# Patient Record
Sex: Male | Born: 1950 | Race: Black or African American | Hispanic: No | Marital: Married | Smoking: Former smoker
Health system: Southern US, Community
[De-identification: ages and names within clinical notes are randomized; demographics above are authoritative.]

## PROBLEM LIST (undated history)

## (undated) DIAGNOSIS — E785 Hyperlipidemia, unspecified: Secondary | ICD-10-CM

## (undated) DIAGNOSIS — I10 Essential (primary) hypertension: Secondary | ICD-10-CM

## (undated) DIAGNOSIS — E669 Obesity, unspecified: Secondary | ICD-10-CM

## (undated) DIAGNOSIS — I2510 Atherosclerotic heart disease of native coronary artery without angina pectoris: Secondary | ICD-10-CM

## (undated) DIAGNOSIS — R079 Chest pain, unspecified: Secondary | ICD-10-CM

## (undated) DIAGNOSIS — R37 Sexual dysfunction, unspecified: Secondary | ICD-10-CM

## (undated) HISTORY — DX: Hyperlipidemia, unspecified: E78.5

## (undated) HISTORY — PX: CORONARY STENT INTERVENTION: CATH118234

## (undated) HISTORY — PX: KNEE SURGERY: SHX244

## (undated) HISTORY — DX: Obesity, unspecified: E66.9

## (undated) HISTORY — DX: Sexual dysfunction, unspecified: R37

---

## 1997-10-11 ENCOUNTER — Ambulatory Visit (HOSPITAL_COMMUNITY): Admission: RE | Admit: 1997-10-11 | Discharge: 1997-10-11 | Payer: Self-pay | Admitting: Internal Medicine

## 1999-05-01 ENCOUNTER — Encounter: Payer: Self-pay | Admitting: Internal Medicine

## 1999-05-01 ENCOUNTER — Encounter: Admission: RE | Admit: 1999-05-01 | Discharge: 1999-05-01 | Payer: Self-pay | Admitting: Internal Medicine

## 1999-05-03 ENCOUNTER — Emergency Department (HOSPITAL_COMMUNITY): Admission: EM | Admit: 1999-05-03 | Discharge: 1999-05-03 | Payer: Self-pay | Admitting: Emergency Medicine

## 1999-05-03 ENCOUNTER — Encounter: Payer: Self-pay | Admitting: Emergency Medicine

## 1999-06-01 ENCOUNTER — Ambulatory Visit (HOSPITAL_COMMUNITY): Admission: RE | Admit: 1999-06-01 | Discharge: 1999-06-01 | Payer: Self-pay | Admitting: Orthopedic Surgery

## 1999-06-01 ENCOUNTER — Encounter: Payer: Self-pay | Admitting: Orthopedic Surgery

## 2000-05-10 ENCOUNTER — Ambulatory Visit (HOSPITAL_COMMUNITY): Admission: RE | Admit: 2000-05-10 | Discharge: 2000-05-10 | Payer: Self-pay | Admitting: Neurosurgery

## 2002-04-23 ENCOUNTER — Emergency Department (HOSPITAL_COMMUNITY): Admission: EM | Admit: 2002-04-23 | Discharge: 2002-04-23 | Payer: Self-pay | Admitting: Emergency Medicine

## 2002-04-23 ENCOUNTER — Encounter: Payer: Self-pay | Admitting: Emergency Medicine

## 2003-12-02 ENCOUNTER — Inpatient Hospital Stay (HOSPITAL_COMMUNITY): Admission: EM | Admit: 2003-12-02 | Discharge: 2003-12-04 | Payer: Self-pay | Admitting: *Deleted

## 2003-12-29 ENCOUNTER — Inpatient Hospital Stay (HOSPITAL_BASED_OUTPATIENT_CLINIC_OR_DEPARTMENT_OTHER): Admission: RE | Admit: 2003-12-29 | Discharge: 2003-12-29 | Payer: Self-pay | Admitting: Cardiology

## 2004-01-12 ENCOUNTER — Emergency Department (HOSPITAL_COMMUNITY): Admission: EM | Admit: 2004-01-12 | Discharge: 2004-01-12 | Payer: Self-pay | Admitting: Family Medicine

## 2005-04-16 ENCOUNTER — Ambulatory Visit (HOSPITAL_BASED_OUTPATIENT_CLINIC_OR_DEPARTMENT_OTHER): Admission: RE | Admit: 2005-04-16 | Discharge: 2005-04-16 | Payer: Self-pay | Admitting: Internal Medicine

## 2005-04-21 ENCOUNTER — Ambulatory Visit: Payer: Self-pay | Admitting: Internal Medicine

## 2005-09-24 IMAGING — CR Imaging study
1 series · 1 of 1 positions shown · non-contrast
Comparison: none

CLINICAL DATA: Chest pain. 
 PORTABLE CHEST - 12/02/03 
 Heart size and vascularity are normal and the lungs are clear.  No significant bony abnormality.
 IMPRESSION
 Normal chest.

[view not recorded]
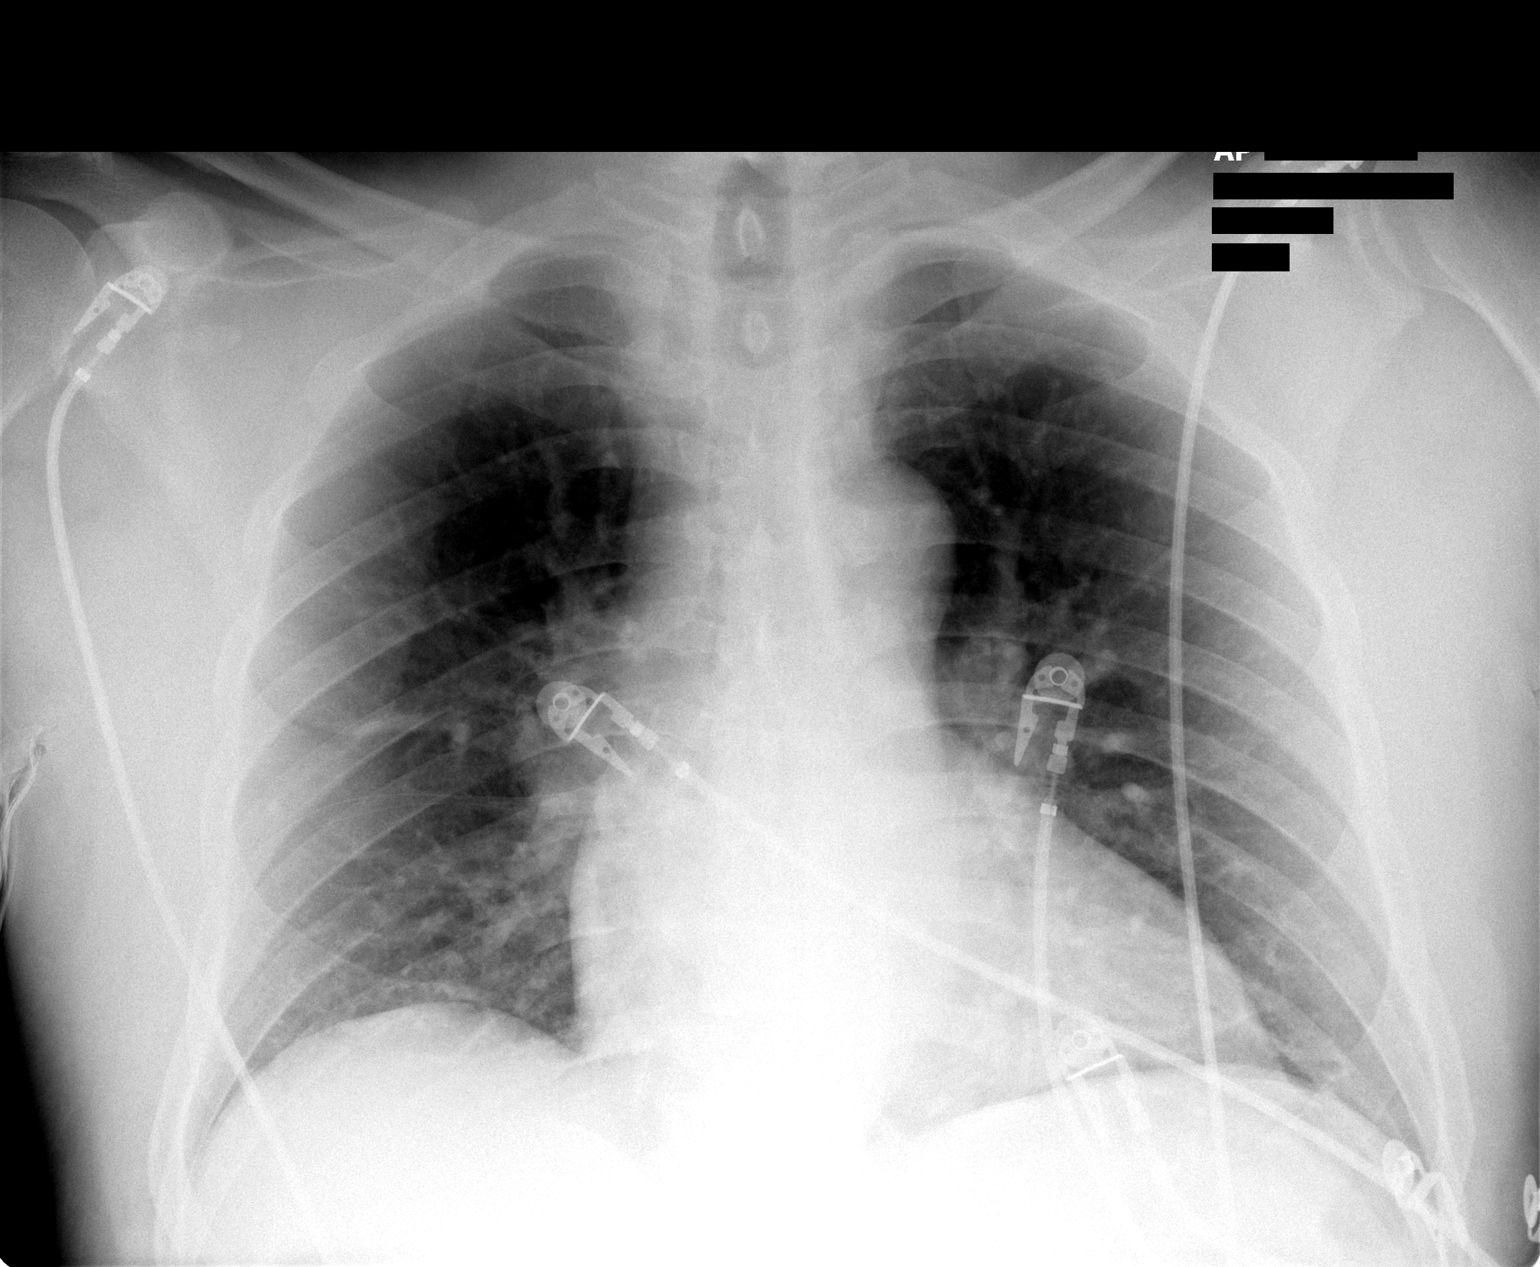

[1 of 1 positions shown; findings below may reference images not displayed]

## 2006-07-23 ENCOUNTER — Emergency Department (HOSPITAL_COMMUNITY): Admission: EM | Admit: 2006-07-23 | Discharge: 2006-07-23 | Payer: Self-pay | Admitting: Emergency Medicine

## 2006-07-25 ENCOUNTER — Inpatient Hospital Stay (HOSPITAL_COMMUNITY): Admission: EM | Admit: 2006-07-25 | Discharge: 2006-07-28 | Payer: Self-pay | Admitting: Emergency Medicine

## 2007-07-14 ENCOUNTER — Emergency Department (HOSPITAL_COMMUNITY): Admission: EM | Admit: 2007-07-14 | Discharge: 2007-07-14 | Payer: Self-pay | Admitting: Emergency Medicine

## 2009-04-12 ENCOUNTER — Emergency Department (HOSPITAL_COMMUNITY): Admission: EM | Admit: 2009-04-12 | Discharge: 2009-04-13 | Payer: Self-pay | Admitting: Emergency Medicine

## 2009-04-18 ENCOUNTER — Observation Stay (HOSPITAL_COMMUNITY): Admission: EM | Admit: 2009-04-18 | Discharge: 2009-04-20 | Payer: Self-pay | Admitting: Emergency Medicine

## 2009-06-26 ENCOUNTER — Encounter: Payer: Self-pay | Admitting: Internal Medicine

## 2009-07-27 ENCOUNTER — Inpatient Hospital Stay (HOSPITAL_COMMUNITY): Admission: EM | Admit: 2009-07-27 | Discharge: 2009-07-31 | Payer: Self-pay | Admitting: Emergency Medicine

## 2009-08-23 ENCOUNTER — Telehealth: Payer: Self-pay | Admitting: Internal Medicine

## 2010-01-04 ENCOUNTER — Ambulatory Visit (HOSPITAL_COMMUNITY): Admission: RE | Admit: 2010-01-04 | Discharge: 2010-01-04 | Payer: Self-pay | Admitting: Cardiology

## 2010-02-03 ENCOUNTER — Emergency Department (HOSPITAL_COMMUNITY): Admission: EM | Admit: 2010-02-03 | Discharge: 2010-02-03 | Payer: Self-pay | Admitting: Emergency Medicine

## 2010-05-08 NOTE — Progress Notes (Signed)
Summary: nos appt  Phone Note Call from Patient   Caller: [email or web address]  Call For: Veola Cafaro Summary of Call: LMTCB x2. Initial call taken by: Darletta Moll,  Aug 23, 2009 2:41 PM

## 2010-05-08 NOTE — Assessment & Plan Note (Signed)
Summary: SOB/MBW    Other Orders: No Show NS50 (NS50)

## 2010-06-21 LAB — BASIC METABOLIC PANEL
Calcium: 9 mg/dL (ref 8.4–10.5)
Chloride: 107 mEq/L (ref 96–112)
GFR calc Af Amer: >60 mL/min (ref >60)
Glucose, Bld: 105 mg/dL — ABNORMAL HIGH (ref 70–99)

## 2010-06-21 LAB — PROTIME-INR: INR: 1.01 (ref 0.00–1.49)

## 2010-06-24 LAB — CARDIAC PANEL(CRET KIN+CKTOT+MB+TROPI)
CK, MB: 2.8 ng/mL (ref 0.3–4.0)
CK, MB: 3 ng/mL (ref 0.3–4.0)
CK, MB: 3.8 ng/mL (ref 0.3–4.0)
Relative Index: 0.9 (ref 0.0–2.5)
Total CK: 345 U/L — ABNORMAL HIGH (ref 7–232)
Total CK: 351 U/L — ABNORMAL HIGH (ref 7–232)
Total CK: 442 U/L — ABNORMAL HIGH (ref 7–232)
Troponin I: 0.04 ng/mL (ref 0.00–0.06)
Troponin I: 0.04 ng/mL (ref 0.00–0.06)
Troponin I: 0.05 ng/mL (ref 0.00–0.06)

## 2010-06-24 LAB — DIFFERENTIAL
Basophils Relative: 0 % (ref 0–1)
Eosinophils Relative: 7 % — ABNORMAL HIGH (ref 0–5)
Lymphs Abs: 1.7 10*3/uL (ref 0.7–4.0)
Monocytes Absolute: 0.2 10*3/uL (ref 0.1–1.0)
Monocytes Relative: 7 % (ref 3–12)
Neutro Abs: 1.5 10*3/uL — ABNORMAL LOW (ref 1.7–7.7)

## 2010-06-24 LAB — BASIC METABOLIC PANEL
BUN: 6 mg/dL (ref 6–23)
CO2: 27 mEq/L (ref 19–32)
Creatinine, Ser: 1.08 mg/dL (ref 0.4–1.5)
GFR calc non Af Amer: >60 mL/min (ref >60)
Glucose, Bld: 98 mg/dL (ref 70–99)
Potassium: 3.9 mEq/L (ref 3.5–5.1)
Sodium: 139 mEq/L (ref 135–145)

## 2010-06-24 LAB — COMPREHENSIVE METABOLIC PANEL
AST: 30 U/L (ref 0–37)
Albumin: 3.5 g/dL (ref 3.5–5.2)
BUN: 3 mg/dL — ABNORMAL LOW (ref 6–23)
CO2: 25 mEq/L (ref 19–32)
Chloride: 106 mEq/L (ref 96–112)
GFR calc Af Amer: >60 mL/min (ref >60)
GFR calc non Af Amer: >60 mL/min (ref >60)

## 2010-06-24 LAB — LIPID PANEL
LDL Cholesterol: 129 mg/dL — ABNORMAL HIGH (ref 0–99)
Total CHOL/HDL Ratio: 5.6 RATIO
VLDL: 26 mg/dL (ref 0–40)

## 2010-06-24 LAB — CBC
HCT: 41.7 % (ref 39.0–52.0)
Hemoglobin: 14.2 g/dL (ref 13.0–17.0)
MCHC: 34.5 g/dL (ref 30.0–36.0)
MCV: 88 fL (ref 78.0–100.0)
RBC: 4.7 MIL/uL (ref 4.22–5.81)
WBC: 3.6 10*3/uL — ABNORMAL LOW (ref 4.0–10.5)

## 2010-06-24 LAB — POCT CARDIAC MARKERS
CKMB, poc: 3.9 ng/mL (ref 1.0–8.0)
Myoglobin, poc: 223 ng/mL (ref 12–200)
Troponin i, poc: <0.05 ng/mL (ref 0.00–0.09)

## 2010-06-24 LAB — PROTIME-INR: Prothrombin Time: 13.8 seconds (ref 11.6–15.2)

## 2010-06-24 LAB — BRAIN NATRIURETIC PEPTIDE: Pro B Natriuretic peptide (BNP): <30 pg/mL (ref 0.0–100.0)

## 2010-06-26 LAB — BASIC METABOLIC PANEL
BUN: 6 mg/dL (ref 6–23)
BUN: 7 mg/dL (ref 6–23)
BUN: 8 mg/dL (ref 6–23)
CO2: 27 mEq/L (ref 19–32)
CO2: 28 mEq/L (ref 19–32)
Calcium: 9 mg/dL (ref 8.4–10.5)
Calcium: 9 mg/dL (ref 8.4–10.5)
Creatinine, Ser: 1.07 mg/dL (ref 0.4–1.5)
Creatinine, Ser: 1.09 mg/dL (ref 0.4–1.5)
Creatinine, Ser: 1.12 mg/dL (ref 0.4–1.5)
GFR calc Af Amer: >60 mL/min (ref >60)
GFR calc Af Amer: >60 mL/min (ref >60)
GFR calc non Af Amer: >60 mL/min (ref >60)
GFR calc non Af Amer: >60 mL/min (ref >60)
GFR calc non Af Amer: >60 mL/min (ref >60)
Glucose, Bld: 107 mg/dL — ABNORMAL HIGH (ref 70–99)
Glucose, Bld: 116 mg/dL — ABNORMAL HIGH (ref 70–99)
Potassium: 3.8 mEq/L (ref 3.5–5.1)
Potassium: 3.9 mEq/L (ref 3.5–5.1)

## 2010-06-26 LAB — CARDIAC PANEL(CRET KIN+CKTOT+MB+TROPI)
CK, MB: 2 ng/mL (ref 0.3–4.0)
CK, MB: 2.7 ng/mL (ref 0.3–4.0)
CK, MB: 3.2 ng/mL (ref 0.3–4.0)
Relative Index: 0.9 (ref 0.0–2.5)
Relative Index: 0.9 (ref 0.0–2.5)
Relative Index: 0.9 (ref 0.0–2.5)
Relative Index: 1.1 (ref 0.0–2.5)
Total CK: 181 U/L (ref 7–232)
Total CK: 310 U/L — ABNORMAL HIGH (ref 7–232)
Troponin I: 0.04 ng/mL (ref 0.00–0.06)
Troponin I: 0.06 ng/mL (ref 0.00–0.06)

## 2010-06-26 LAB — CBC
HCT: 37.3 % — ABNORMAL LOW (ref 39.0–52.0)
HCT: 40.3 % (ref 39.0–52.0)
Hemoglobin: 13.4 g/dL (ref 13.0–17.0)
Hemoglobin: 13.8 g/dL (ref 13.0–17.0)
MCHC: 34.8 g/dL (ref 30.0–36.0)
MCV: 89.1 fL (ref 78.0–100.0)
Platelets: 183 10*3/uL (ref 150–400)
Platelets: 204 10*3/uL (ref 150–400)
RBC: 4.46 MIL/uL (ref 4.22–5.81)
RDW: 14.1 % (ref 11.5–15.5)
RDW: 14.4 % (ref 11.5–15.5)
RDW: 14.6 % (ref 11.5–15.5)
WBC: 5.1 10*3/uL (ref 4.0–10.5)

## 2010-06-26 LAB — DIFFERENTIAL
Basophils Absolute: 0 10*3/uL (ref 0.0–0.1)
Eosinophils Absolute: 0.3 10*3/uL (ref 0.0–0.7)
Eosinophils Relative: 8 % — ABNORMAL HIGH (ref 0–5)
Lymphocytes Relative: 49 % — ABNORMAL HIGH (ref 12–46)
Neutrophils Relative %: 34 % — ABNORMAL LOW (ref 43–77)

## 2010-06-26 LAB — POCT CARDIAC MARKERS
Myoglobin, poc: 189 ng/mL (ref 12–200)
Troponin i, poc: 0.08 ng/mL (ref 0.00–0.09)
Troponin i, poc: <0.05 ng/mL (ref 0.00–0.09)

## 2010-06-26 LAB — LIPID PANEL
Cholesterol: 246 mg/dL — ABNORMAL HIGH (ref 0–200)
LDL Cholesterol: 176 mg/dL — ABNORMAL HIGH (ref 0–99)
VLDL: 32 mg/dL (ref 0–40)

## 2010-06-26 LAB — CK TOTAL AND CKMB (NOT AT ARMC)
Relative Index: 0.9 (ref 0.0–2.5)
Total CK: 425 U/L — ABNORMAL HIGH (ref 7–232)

## 2010-06-26 LAB — TROPONIN I: Troponin I: 0.09 ng/mL — ABNORMAL HIGH (ref 0.00–0.06)

## 2010-06-26 LAB — MRSA PCR SCREENING: MRSA by PCR: NEGATIVE

## 2010-06-26 LAB — PLATELET INHIBITION P2Y12: Platelet Function Baseline: 337 [PRU] (ref 194–418)

## 2010-08-24 NOTE — Procedures (Signed)
NAME:  Nicholas Carroll, Nicholas Carroll              ACCOUNT NO.:  [id number]   MEDICAL RECORD NO.:  [id number]          PATIENT TYPE:  OUT   LOCATION:  SLEEP CENTER                 FACILITY:  [facility]   PHYSICIAN:  Clinton D. Maple Hudson, M.D. DATE OF BIRTH:  27-Jul-1950   DATE OF STUDY:  04/16/2005                              NOCTURNAL POLYSOMNOGRAM   REFERRING PHYSICIAN:  Dr. Willey Blade.   DATE OF STUDY:  April 16, 2005.   INDICATION FOR STUDY:  Hypersomnia with sleep apnea.   EPWORTH SLEEPINESS SCORE:  12/24.   BMI:  29.7.   WEIGHT:  232 pounds. 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   HOME MEDICATIONS:  Listed as triamterene with hydrochlorothiazide.   SLEEP ARCHITECTURE:  Total sleep time 359 minutes with sleep efficiency 89%.  Stage I was 6%, stage II 70%, stages III and IV were absent, REM 24% of  total sleep time. Sleep latency 12 minutes, REM latency 54 minutes, awake  after sleep onset 30 minutes, arousal index 25. No bedtime medication taken.   RESPIRATORY DATA:  Apnea/hypopnea index (AHI, RDI) 8.5 obstructive events  per hour indicating mild obstructive sleep apnea/hypopnea syndrome. This  included 3 obstructive apneas and 48 hypopneas. Events were nonpositional.  REM AHI 5.5 per hour. He had insufficient events to permit use of C-PAP  titration by protocol on the study night.   OXYGEN DATA:  Mild to moderate snoring with oxygen desaturation to a nadir  of 86%. Mean oxygen saturation through the study was 94% on room air.   CARDIAC DATA:  Sinus rhythm with occasional PVCs and PAC.   MOVEMENT/PARASOMNIA:  A total of 146 limb jerks were reported of which 17  were associated with arousal or awakening for a periodic limb movement with  arousal index of 2.8 per hour which is minimally increased and of doubtful  significance.   IMPRESSION/RECOMMENDATIONS:  1.  Mild obstructive sleep  apnea/hypopnea syndrome, AHI 8.5 per hour with      nonpositional events, mild to moderate snoring and oxygen desaturation      to 86%.  2.  Scores in this range may qualify for a trial of C-PAP titration if sleep      apnea is symptomatic or associated with organic dysfunction such as      hypertension and if more conservative measures such as weight loss,      treatment for nasal congestion and sleeping off flat of back are      insufficient.  3.  Periodic limb movement with arousal, 2.8 per hour.      Clinton D. Maple Hudson, M.D.  Diplomate, Biomedical engineer of Sleep Medicine  Electronically Signed     CDY/MEDQ  D:  04/21/2005 11:52:00  T:  04/21/2005 22:48:56  Job:  [id number]

## 2010-08-24 NOTE — Discharge Summary (Signed)
NAMESENICA, CRALL              ACCOUNT NO.:  [id number]   MEDICAL RECORD NO.:  [id number]          PATIENT TYPE:  INP   LOCATION:  4711                         FACILITY:  [facility]   PHYSICIAN:  Eric L. August Saucer, M.D.     DATE OF BIRTH:  Mar 23, 1951   DATE OF ADMISSION:  07/25/2006  DATE OF DISCHARGE:  07/28/2006                               DISCHARGE SUMMARY   FINAL DIAGNOSES:  1. Chest pain noncardiac, 786.59.  2. Coronary atherosclerosis in native coronary vessels, 414.01.  3. Hypertension, 401.9.  4. Hypercholesteremia, 272.0.  5. Esophageal reflux, 530.81.  6. History of tobacco use, 315.82.  7. Family history of heart disease, 817.30.  8. Morbid obesity, 278.01.   OPERATIONS AND PROCEDURES:  Persantine Myoview study.   HISTORY OF PRESENT ILLNESS:  First recent [facility] admission  for this 60 year old married black male with a past medical history  significant for mild coronary artery disease, hypertension,  hyperlipidemia, gastroesophageal reflux disease, remote history of  tobacco abuse and family history of coronary artery disease.  The  patient came to the emergency room via EMS after presenting to the  office complaining of precordial chest pain with radiation into the left  arm with nausea, diaphoresis and mild shortness of breath.  Pain was  graded as 6/10.  The patient was given sublingual nitroglycerin and  aspirin with mild relief.  He was subsequently started on IV heparin and  nitroglycerin while being evaluated in the emergency room.  The patient  was seen by Dr. Sharyn Lull and subsequently admitted for evaluation of  chest pain, rule out coronary artery disease.  Past medical history and  physical exam is per admission history and physical.   HOSPITAL COURSE:  The patient was admitted to telemetry for evaluation  and treatment of possible underlying coronary artery disease.  At the  time of presentation, his cardiac enzymes were negative.  He was  placed  on IV nitroglycerin and IV heparin per protocol.  He was given aspirin  325 mg on day one of admission as well.  Over the subsequent 24 hours,  the patient's chest pain did subside.  His cardiac enzymes were negative  for signs of acute injury.  The patient on July 27, 2006 subsequently  underwent a nuclear medicine myocardial study.  He was found to have an  ejection fraction of 51%.  There was no evidence for reversible or  irreversible defects.  This was felt to be a negative study.   While the patient was hospitalized, he also had transient mild  bradycardia with heart rates in the 40s.  This did however improve with  activity into the 60s.  No further evaluation was pursued as his blood  pressure was maintained as well.   The patient also had a history of gastroesophageal reflux disease.  In  lieu of his cardiac evaluation being negative, it was felt that some of  his chest pain may be secondary to this previously known entity.  He was  subsequently placed on Nexium 40 mg daily which he tolerated.   By July 28, 2006  he was feeling much better.  He had no further chest  pains.  The patient was ambulatory and tolerated this well.  Vital signs  were stable.  Results of studies were reviewed with the patient again.  It was felt that he was stable for discharge home with further follow-up  as an outpatient.   Medications at time of discharge consisted of Norvasc 5 mg daily,  Ecotrin 81 mg daily, Protonix 40 mg daily and Crestor 10 mg daily for  his elevated cholesterol.  He will be maintained on a low-sodium heart-  healthy diet.  He is to be seen in the office in two weeks time for  follow-up.           ______________________________  Lind Guest. August Saucer, M.D.     ELD/MEDQ  D:  09/10/2006  T:  09/11/2006  Job:  [id number]

## 2010-12-11 ENCOUNTER — Other Ambulatory Visit (HOSPITAL_COMMUNITY): Payer: Self-pay | Admitting: Cardiology

## 2010-12-26 ENCOUNTER — Other Ambulatory Visit (HOSPITAL_COMMUNITY): Payer: Self-pay

## 2011-01-01 LAB — DIFFERENTIAL
Basophils Relative: 1
Lymphocytes Relative: 41
Lymphs Abs: 2.3
Monocytes Absolute: 0.5
Monocytes Relative: 10
Neutro Abs: 2.2
Neutrophils Relative %: 40 — ABNORMAL LOW

## 2011-01-01 LAB — POCT I-STAT, CHEM 8
BUN: 13
Calcium, Ion: 1.13
Chloride: 105
Creatinine, Ser: 1.4
Glucose, Bld: 82
HCT: 40
Hemoglobin: 13.6
Potassium: 3.7
Sodium: 140
TCO2: 25

## 2011-01-01 LAB — CBC
Hemoglobin: 13.5
MCHC: 35
RBC: 4.49
WBC: 5.5

## 2011-01-07 ENCOUNTER — Encounter (HOSPITAL_COMMUNITY)
Admission: RE | Admit: 2011-01-07 | Discharge: 2011-01-07 | Disposition: A | Discharge status: Home / Self Care | Payer: BC Managed Care – PPO | Source: Ambulatory Visit | Attending: Cardiology | Admitting: Cardiology

## 2011-01-07 ENCOUNTER — Ambulatory Visit (HOSPITAL_COMMUNITY)
Admission: RE | Admit: 2011-01-07 | Discharge: 2011-01-07 | Disposition: A | Discharge status: Home / Self Care | Payer: BC Managed Care – PPO | Source: Ambulatory Visit | Attending: Cardiology | Admitting: Cardiology

## 2011-01-07 DIAGNOSIS — I498 Other specified cardiac arrhythmias: Secondary | ICD-10-CM | POA: Insufficient documentation

## 2011-01-07 DIAGNOSIS — R079 Chest pain, unspecified: Secondary | ICD-10-CM | POA: Insufficient documentation

## 2011-01-07 DIAGNOSIS — E785 Hyperlipidemia, unspecified: Secondary | ICD-10-CM | POA: Insufficient documentation

## 2011-01-07 DIAGNOSIS — I10 Essential (primary) hypertension: Secondary | ICD-10-CM | POA: Insufficient documentation

## 2011-01-07 DIAGNOSIS — I2510 Atherosclerotic heart disease of native coronary artery without angina pectoris: Secondary | ICD-10-CM | POA: Insufficient documentation

## 2011-01-07 DIAGNOSIS — R0602 Shortness of breath: Secondary | ICD-10-CM | POA: Insufficient documentation

## 2011-01-07 MED ORDER — TECHNETIUM TC 99M TETROFOSMIN IV KIT
30.0000 | PACK | Freq: Once | INTRAVENOUS | Status: AC | PRN
  Administered 2011-01-07: 30 via INTRAVENOUS

## 2011-01-07 MED ORDER — TECHNETIUM TC 99M TETROFOSMIN IV KIT
10.0000 | PACK | Freq: Once | INTRAVENOUS | Status: AC | PRN
  Administered 2011-01-07: 10 via INTRAVENOUS

## 2011-06-22 ENCOUNTER — Other Ambulatory Visit: Payer: Self-pay

## 2011-06-22 ENCOUNTER — Encounter (HOSPITAL_COMMUNITY): Payer: Self-pay

## 2011-06-22 ENCOUNTER — Emergency Department (HOSPITAL_COMMUNITY): Payer: BC Managed Care – PPO

## 2011-06-22 ENCOUNTER — Emergency Department (HOSPITAL_COMMUNITY)
Admission: EM | Admit: 2011-06-22 | Discharge: 2011-06-22 | Disposition: A | Discharge status: Home / Self Care | Payer: BC Managed Care – PPO | Attending: Emergency Medicine | Admitting: Emergency Medicine

## 2011-06-22 DIAGNOSIS — J209 Acute bronchitis, unspecified: Secondary | ICD-10-CM

## 2011-06-22 DIAGNOSIS — I2510 Atherosclerotic heart disease of native coronary artery without angina pectoris: Secondary | ICD-10-CM | POA: Insufficient documentation

## 2011-06-22 DIAGNOSIS — R059 Cough, unspecified: Secondary | ICD-10-CM | POA: Insufficient documentation

## 2011-06-22 DIAGNOSIS — R05 Cough: Secondary | ICD-10-CM | POA: Insufficient documentation

## 2011-06-22 DIAGNOSIS — R509 Fever, unspecified: Secondary | ICD-10-CM | POA: Insufficient documentation

## 2011-06-22 DIAGNOSIS — J3489 Other specified disorders of nose and nasal sinuses: Secondary | ICD-10-CM | POA: Insufficient documentation

## 2011-06-22 DIAGNOSIS — R5381 Other malaise: Secondary | ICD-10-CM | POA: Insufficient documentation

## 2011-06-22 HISTORY — DX: Atherosclerotic heart disease of native coronary artery without angina pectoris: I25.10

## 2011-06-22 MED ORDER — AZITHROMYCIN 250 MG PO TABS: 250.0000 mg | ORAL_TABLET | Freq: Every day | ORAL | Status: AC

## 2011-06-22 MED ORDER — HYDROCOD POLST-CHLORPHEN POLST 10-8 MG/5ML PO LQCR: 5.0000 mL | Freq: Two times a day (BID) | ORAL | Status: DC | PRN

## 2011-06-22 NOTE — ED Provider Notes (Signed)
History     CSN: [id number]  Arrival date & time 06/22/11  1111   First MD Initiated Contact with Patient 06/22/11 1128      Chief Complaint  Patient presents with  . URI    (Consider location/radiation/quality/duration/timing/severity/associated sxs/prior treatment) Patient is a 61 y.o. male presenting with URI. The history is provided by the patient.  URI The primary symptoms include fever, fatigue and cough. Episode onset: 2 weeks ago. This is a new problem. The problem has been gradually worsening.  Symptoms associated with the illness include chills, sinus pressure and congestion. The following treatments were addressed: Acetaminophen was ineffective. A decongestant was ineffective. Risk factors for severe complications from URI include chronic cardiac disease.    Past Medical History  Diagnosis Date  . Coronary artery disease     History reviewed. No pertinent past surgical history.  History reviewed. No pertinent family history.  History  Substance Use Topics  . Smoking status: Never Smoker   . Smokeless tobacco: Not on file  . Alcohol Use: No      Review of Systems  Constitutional: Positive for fever, chills and fatigue.  HENT: Positive for congestion and sinus pressure.   Respiratory: Positive for cough.   Cardiovascular: Negative for chest pain.  All other systems reviewed and are negative.    Allergies  Review of patient's allergies indicates no known allergies.  Home Medications  No current outpatient prescriptions on file.  BP 156/106  Pulse 72  Temp(Src) 98.9 F (37.2 C) (Oral)  Resp 20  SpO2 100%  Physical Exam  Nursing note and vitals reviewed. Constitutional: He is oriented to person, place, and time. He appears well-developed and well-nourished. No distress.  HENT:  Head: Normocephalic and atraumatic.  Right Ear: External ear normal.  Left Ear: External ear normal.  Mouth/Throat: Oropharynx is clear and moist. No oropharyngeal  exudate.  Neck: Normal range of motion. Neck supple.  Cardiovascular: Normal rate and regular rhythm.   No murmur heard. Pulmonary/Chest: Effort normal and breath sounds normal. No respiratory distress. He has no wheezes. He has no rales.  Abdominal: Soft. Bowel sounds are normal. He exhibits no distension.  Musculoskeletal: Normal range of motion.  Lymphadenopathy:    He has no cervical adenopathy.  Neurological: He is alert and oriented to person, place, and time.  Skin: Skin is warm and dry. He is not diaphoretic.    ED Course  Procedures (including critical care time)  Labs Reviewed - No data to display No results found.   No diagnosis found.   Date: 06/22/2011  Rate: 59  Rhythm: sinus bradycardia  QRS Axis: normal  Intervals: normal  ST/T Wave abnormalities: normal  Conduction Disutrbances:none  Narrative Interpretation:   Old EKG Reviewed: unchanged    MDM  The ekg looks okay as does the chest xray.  He has been two weeks with these symptoms and is coughing up phlegm.  Will treat with antibiotic, to return prn.        Geoffery Lyons, MD 06/22/11 1212

## 2011-06-22 NOTE — ED Notes (Signed)
Pt here with cough and sob, upper respiratory complaints, onset 2 weeks ago. No help with meds,

## 2011-06-22 NOTE — Discharge Instructions (Signed)

## 2011-08-06 ENCOUNTER — Other Ambulatory Visit: Payer: Self-pay | Admitting: Cardiology

## 2012-06-10 ENCOUNTER — Encounter: Payer: Self-pay | Admitting: Hematology

## 2012-08-23 ENCOUNTER — Emergency Department (HOSPITAL_COMMUNITY): Payer: BC Managed Care – PPO

## 2012-08-23 ENCOUNTER — Emergency Department (HOSPITAL_COMMUNITY)
Admission: EM | Admit: 2012-08-23 | Discharge: 2012-08-23 | Disposition: A | Discharge status: Home / Self Care | Payer: BC Managed Care – PPO | Attending: Emergency Medicine | Admitting: Emergency Medicine

## 2012-08-23 ENCOUNTER — Encounter (HOSPITAL_COMMUNITY): Payer: Self-pay | Admitting: *Deleted

## 2012-08-23 DIAGNOSIS — I2510 Atherosclerotic heart disease of native coronary artery without angina pectoris: Secondary | ICD-10-CM | POA: Insufficient documentation

## 2012-08-23 DIAGNOSIS — Y929 Unspecified place or not applicable: Secondary | ICD-10-CM | POA: Insufficient documentation

## 2012-08-23 DIAGNOSIS — Z862 Personal history of diseases of the blood and blood-forming organs and certain disorders involving the immune mechanism: Secondary | ICD-10-CM | POA: Insufficient documentation

## 2012-08-23 DIAGNOSIS — Z8639 Personal history of other endocrine, nutritional and metabolic disease: Secondary | ICD-10-CM | POA: Insufficient documentation

## 2012-08-23 DIAGNOSIS — W11XXXA Fall on and from ladder, initial encounter: Secondary | ICD-10-CM | POA: Insufficient documentation

## 2012-08-23 DIAGNOSIS — S199XXA Unspecified injury of neck, initial encounter: Secondary | ICD-10-CM | POA: Insufficient documentation

## 2012-08-23 DIAGNOSIS — S060X9A Concussion with loss of consciousness of unspecified duration, initial encounter: Secondary | ICD-10-CM | POA: Insufficient documentation

## 2012-08-23 DIAGNOSIS — IMO0002 Reserved for concepts with insufficient information to code with codable children: Secondary | ICD-10-CM | POA: Insufficient documentation

## 2012-08-23 DIAGNOSIS — S0993XA Unspecified injury of face, initial encounter: Secondary | ICD-10-CM | POA: Insufficient documentation

## 2012-08-23 DIAGNOSIS — W19XXXA Unspecified fall, initial encounter: Secondary | ICD-10-CM

## 2012-08-23 DIAGNOSIS — S4980XA Other specified injuries of shoulder and upper arm, unspecified arm, initial encounter: Secondary | ICD-10-CM | POA: Insufficient documentation

## 2012-08-23 DIAGNOSIS — E669 Obesity, unspecified: Secondary | ICD-10-CM | POA: Insufficient documentation

## 2012-08-23 DIAGNOSIS — Y9389 Activity, other specified: Secondary | ICD-10-CM | POA: Insufficient documentation

## 2012-08-23 DIAGNOSIS — S46909A Unspecified injury of unspecified muscle, fascia and tendon at shoulder and upper arm level, unspecified arm, initial encounter: Secondary | ICD-10-CM | POA: Insufficient documentation

## 2012-08-23 DIAGNOSIS — Z8659 Personal history of other mental and behavioral disorders: Secondary | ICD-10-CM | POA: Insufficient documentation

## 2012-08-23 MED ORDER — HYDROCODONE-ACETAMINOPHEN 5-325 MG PO TABS
1.0000 | ORAL_TABLET | Freq: Once | ORAL | Status: AC
  Administered 2012-08-23: 1 via ORAL
  Filled 2012-08-23: qty 1

## 2012-08-23 MED ORDER — DIAZEPAM 5 MG PO TABS: 5.0000 mg | ORAL_TABLET | Freq: Two times a day (BID) | ORAL | Status: DC

## 2012-08-23 MED ORDER — IBUPROFEN 600 MG PO TABS: 600.0000 mg | ORAL_TABLET | Freq: Three times a day (TID) | ORAL | Status: DC

## 2012-08-23 MED ORDER — HYDROCODONE-ACETAMINOPHEN 5-325 MG PO TABS: 1.0000 | ORAL_TABLET | Freq: Three times a day (TID) | ORAL | Status: DC | PRN

## 2012-08-23 MED ORDER — IBUPROFEN 800 MG PO TABS
800.0000 mg | ORAL_TABLET | Freq: Once | ORAL | Status: AC
  Administered 2012-08-23: 800 mg via ORAL
  Filled 2012-08-23: qty 1

## 2012-08-23 NOTE — ED Provider Notes (Signed)
History     CSN: [id number]  Arrival date & time 08/23/12  0559   First MD Initiated Contact with Patient 08/23/12 [phone]      Chief Complaint  Patient presents with  . Fall    HPI  The patient presents one day after falling from a ladder onto his back and neck.  He states he fell from approximately 8 feet, landing primarily on his upper back.  Likely loss of consciousness, and transient confusion. Since the event he has had head pain, neck pain, bilateral upper back and shoulder pain.  No distal dysesthesia or weakness. Minimal relief with Tylenol. Pain is worse with motion. The patient denies concurrent confusion, current vomiting or nausea, current chest pain, dyspnea.   Past Medical History  Diagnosis Date  . Coronary artery disease   . Obesity   . Sexual dysfunction   . Hyperlipidemia     History reviewed. No pertinent past surgical history.  No family history on file.  History  Substance Use Topics  . Smoking status: Never Smoker   . Smokeless tobacco: Not on file  . Alcohol Use: No      Review of Systems  Constitutional:       Per HPI, otherwise negative  HENT:       Per HPI, otherwise negative  Respiratory:       Per HPI, otherwise negative  Cardiovascular:       Per HPI, otherwise negative  Gastrointestinal: Negative for nausea, vomiting and abdominal pain.  Endocrine:       Negative aside from HPI  Genitourinary:       Neg aside from HPI   Musculoskeletal:       Per HPI, otherwise negative  Skin: Negative.   Neurological: Negative for syncope and weakness.    Allergies  Review of patient's allergies indicates no known allergies.  Home Medications   Current Outpatient Rx  Name  Route  Sig  Dispense  Refill  . acetaminophen (TYLENOL) 325 MG tablet   Oral   Take by mouth every 6 (six) hours as needed for pain.           BP 169/95  Pulse 58  Temp(Src) 98.1 F (36.7 C)  Resp 18  SpO2 98%  Physical Exam  Nursing note and vitals  reviewed. Constitutional: He is oriented to person, place, and time. He appears well-developed and well-nourished.  HENT:  Head: Normocephalic and atraumatic.  Eyes: Conjunctivae and EOM are normal. Right eye exhibits no discharge. Left eye exhibits no discharge.  Neck: Trachea normal. Neck supple. Spinous process tenderness and muscular tenderness present. No rigidity. Decreased range of motion present. No edema and no erythema present.  Cardiovascular: Normal rate and regular rhythm.   Pulmonary/Chest: Effort normal. No stridor. No respiratory distress.  Abdominal: He exhibits no distension.  Musculoskeletal: He exhibits no edema.       Right shoulder: He exhibits decreased range of motion, tenderness, bony tenderness and pain. He exhibits no swelling, no effusion, no crepitus, no deformity, no laceration, no spasm, normal pulse and normal strength.       Left shoulder: He exhibits tenderness, bony tenderness and pain. He exhibits no swelling, no effusion, no crepitus, no deformity, no spasm, normal pulse and normal strength.       Right elbow: Normal.      Left elbow: Normal.       Right wrist: Normal.       Left wrist: Normal.  Right hip: Normal.       Left hip: Normal.  Neurological: He is alert and oriented to person, place, and time. No cranial nerve deficit. He exhibits normal muscle tone. Coordination normal.  Skin: Skin is warm and dry. He is not diaphoretic.  Psychiatric: He has a normal mood and affect.    ED Course  Procedures (including critical care time)  Labs Reviewed - No data to display Ct Cervical Spine Wo Contrast  08/23/2012   *RADIOLOGY REPORT*  Clinical Data: Fall  CT CERVICAL SPINE WITHOUT CONTRAST  Technique:  Multidetector CT imaging of the cervical spine was performed. Multiplanar CT image reconstructions were also generated.  Comparison: None.  Findings: No acute fracture and no dislocation.  Shallow left paracentral protrusion and C2-3.  Patent foramina.   Mild narrowing at C3-4 with a shallow central protrusion.  Posterior and uncovertebral osteophytes at C4-5 with mild central or foraminal narrowing.  Prominent right paracentral posterior disc osteophytes and disc bulge complex causes spinal stenosis at C5-6.  Uncovertebral osteophytes cause mild foraminal narrowing.  Posterior osteophytic ridging at C6-7 results in an element of spinal stenosis.  Uncovertebral osteophytes cause mild foraminal narrowing left greater than right.  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  Normal thyroid gland.  Lung apices clear.  IMPRESSION: No acute bony pathology.  Scattered degenerative changes.   Original Report Authenticated By: Jolaine Click, M.D.     No diagnosis found.  Pulse ox 99% room air normal  Update: After initial XR of thoracic spine was non-diagnostic,  CT ordered.  The patient felt much better on re-eval.   Update: patient substantially better. CT negative.    10:28 AM Patient remains in no distress. We discussed all results return precautions, follow up instructions. MDM  The patient presents one day after falling from a ladder from greater than standing height.  Given the loss of consciousness, has ongoing headache, neck pain, upper back pain, he had radiographic studies performed.  These were largely reassuring.  The patient remained hemodynamically stable, with no new neurologic complaints, and after a largely reassuring evaluation, appeared in observation here, with analgesia provided, is discharged in stable condition with return precautions, analgesics.        Gerhard Munch, MD 08/23/12 [phone]

## 2012-08-23 NOTE — ED Notes (Signed)
Returned from Radiology.  No distress noted.

## 2012-08-23 NOTE — ED Notes (Signed)
MD at bedside. 

## 2012-08-23 NOTE — ED Notes (Signed)
The pt fell from a tree  approx 7 ft cutting limbs.  C/o neck pain since with bi-l;ateral shoulder pain

## 2014-12-22 ENCOUNTER — Encounter (HOSPITAL_COMMUNITY): Payer: Self-pay | Admitting: Emergency Medicine

## 2014-12-22 ENCOUNTER — Emergency Department (HOSPITAL_COMMUNITY): Payer: BC Managed Care – PPO

## 2014-12-22 ENCOUNTER — Observation Stay (HOSPITAL_COMMUNITY)
Admission: EM | Admit: 2014-12-22 | Discharge: 2014-12-24 | Disposition: A | Discharge status: Home / Self Care | Payer: BC Managed Care – PPO | Attending: Internal Medicine | Admitting: Internal Medicine

## 2014-12-22 DIAGNOSIS — R079 Chest pain, unspecified: Secondary | ICD-10-CM | POA: Diagnosis present

## 2014-12-22 DIAGNOSIS — Z7982 Long term (current) use of aspirin: Secondary | ICD-10-CM | POA: Diagnosis not present

## 2014-12-22 DIAGNOSIS — I10 Essential (primary) hypertension: Secondary | ICD-10-CM | POA: Diagnosis not present

## 2014-12-22 DIAGNOSIS — I25119 Atherosclerotic heart disease of native coronary artery with unspecified angina pectoris: Secondary | ICD-10-CM | POA: Diagnosis not present

## 2014-12-22 DIAGNOSIS — R0789 Other chest pain: Secondary | ICD-10-CM | POA: Diagnosis not present

## 2014-12-22 DIAGNOSIS — F17210 Nicotine dependence, cigarettes, uncomplicated: Secondary | ICD-10-CM | POA: Insufficient documentation

## 2014-12-22 DIAGNOSIS — M79606 Pain in leg, unspecified: Secondary | ICD-10-CM | POA: Insufficient documentation

## 2014-12-22 DIAGNOSIS — E669 Obesity, unspecified: Secondary | ICD-10-CM | POA: Insufficient documentation

## 2014-12-22 DIAGNOSIS — I2510 Atherosclerotic heart disease of native coronary artery without angina pectoris: Secondary | ICD-10-CM | POA: Diagnosis not present

## 2014-12-22 DIAGNOSIS — Z9861 Coronary angioplasty status: Secondary | ICD-10-CM | POA: Diagnosis not present

## 2014-12-22 DIAGNOSIS — Z87891 Personal history of nicotine dependence: Secondary | ICD-10-CM | POA: Diagnosis not present

## 2014-12-22 DIAGNOSIS — E785 Hyperlipidemia, unspecified: Secondary | ICD-10-CM | POA: Insufficient documentation

## 2014-12-22 DIAGNOSIS — Z951 Presence of aortocoronary bypass graft: Secondary | ICD-10-CM | POA: Diagnosis not present

## 2014-12-22 HISTORY — DX: Essential (primary) hypertension: I10

## 2014-12-22 LAB — BASIC METABOLIC PANEL WITH GFR
Anion gap: 8 (ref 5–15)
BUN: 9 mg/dL (ref 6–20)
CO2: 24 mmol/L (ref 22–32)
Calcium: 8.9 mg/dL (ref 8.9–10.3)
Chloride: 104 mmol/L (ref 101–111)
Creatinine, Ser: 1.13 mg/dL (ref 0.61–1.24)
GFR calc Af Amer: >60 mL/min
GFR calc non Af Amer: >60 mL/min
Glucose, Bld: 87 mg/dL (ref 65–99)
Potassium: 3.5 mmol/L (ref 3.5–5.1)
Sodium: 136 mmol/L (ref 135–145)

## 2014-12-22 LAB — CBC
HCT: 38.7 % — ABNORMAL LOW (ref 39.0–52.0)
Hemoglobin: 13.4 g/dL (ref 13.0–17.0)
MCH: 29.3 pg (ref 26.0–34.0)
MCHC: 34.6 g/dL (ref 30.0–36.0)
MCV: 84.5 fL (ref 78.0–100.0)
Platelets: 224 K/uL (ref 150–400)
RBC: 4.58 MIL/uL (ref 4.22–5.81)
RDW: 14.1 % (ref 11.5–15.5)
WBC: 5.5 K/uL (ref 4.0–10.5)

## 2014-12-22 LAB — D-DIMER, QUANTITATIVE: D-Dimer, Quant: 0.37 ug{FEU}/mL (ref 0.00–0.48)

## 2014-12-22 LAB — I-STAT TROPONIN, ED: Troponin i, poc: 0.01 ng/mL (ref 0.00–0.08)

## 2014-12-22 MED ORDER — NITROGLYCERIN 0.4 MG SL SUBL
0.4000 mg | SUBLINGUAL_TABLET | Freq: Once | SUBLINGUAL | Status: AC
  Administered 2014-12-22: 0.4 mg via SUBLINGUAL
  Filled 2014-12-22: qty 1

## 2014-12-22 NOTE — ED Notes (Signed)
Pt sts CP across chest that is worse with inspiration and cough x 2 days ;pt sts hx of stents in past

## 2014-12-22 NOTE — ED Provider Notes (Signed)
CSN: [id number]     Arrival date & time 12/22/14  1821 History   First MD Initiated Contact with Patient 12/22/14 2049     Chief Complaint  Patient presents with  . Chest Pain     (Consider location/radiation/quality/duration/timing/severity/associated sxs/prior Treatment) Patient is a 64 y.o. male presenting with chest pain. The history is provided by the patient.  Chest Pain Associated symptoms: shortness of breath   Associated symptoms: no abdominal pain, no back pain, no headache, no nausea, no numbness, not vomiting and no weakness    patient presents with dull chest pain. It is across his lower chest and has been there for about 2 days.it is dull. It came on both suddenly and gradually. It has both been there constantly and come and gone. It gets worse with movements. States it gets worse with exertion. No swelling in his legs. States he took 2 different aspirin home in improved the pain somewhat. States he still has some pain. Pain is worse with deep breaths also. Patientdoes not remember what his heart catheterization was but it was over 5 years ago. States he has really not followed up much since then. May not have taken his blood pressure medicines the last 2 days. He states earlier his chest is very tender. States it is not tender now. No abdominal pain nausea or vomiting.  Past Medical History  Diagnosis Date  . Coronary artery disease   . Obesity   . Sexual dysfunction   . Hyperlipidemia    History reviewed. No pertinent past surgical history. History reviewed. No pertinent family history. Social History  Substance Use Topics  . Smoking status: Never Smoker   . Smokeless tobacco: None  . Alcohol Use: No    Review of Systems  Constitutional: Negative for activity change and appetite change.  Eyes: Negative for pain.  Respiratory: Positive for shortness of breath. Negative for chest tightness.   Cardiovascular: Positive for chest pain. Negative for leg swelling.   Gastrointestinal: Negative for nausea, vomiting, abdominal pain and diarrhea.  Genitourinary: Negative for flank pain.  Musculoskeletal: Negative for back pain and neck stiffness.  Skin: Negative for rash.  Neurological: Negative for weakness, numbness and headaches.  Psychiatric/Behavioral: Negative for behavioral problems.      Allergies  Review of patient's allergies indicates no known allergies.  Home Medications   Prior to Admission medications   Medication Sig Start Date End Date Taking? Authorizing Provider  aspirin 81 MG tablet Take 81 mg by mouth daily.   Yes Historical Provider, MD  diazepam (VALIUM) 5 MG tablet Take 1 tablet (5 mg total) by mouth 2 (two) times daily. Patient not taking: Reported on 12/22/2014 08/23/12   Carmin Muskrat, MD  HYDROcodone-acetaminophen (NORCO/VICODIN) 5-325 MG per tablet Take 1 tablet by mouth every 8 (eight) hours as needed for pain. Patient not taking: Reported on 12/22/2014 08/23/12   Carmin Muskrat, MD  ibuprofen (ADVIL,MOTRIN) 600 MG tablet Take 1 tablet (600 mg total) by mouth 3 (three) times daily. Patient not taking: Reported on 12/22/2014 08/23/12   Carmin Muskrat, MD   BP 149/80 mmHg  Pulse 51  Temp(Src) 98 F (36.7 C) (Oral)  Resp 10  SpO2 99% Physical Exam  Constitutional:  Patient is obese  HENT:  Head: Atraumatic.  Eyes: EOM are normal.  Neck: Neck supple.  Cardiovascular: Normal rate.   Pulmonary/Chest: He exhibits no tenderness.  Abdominal: Soft.  Musculoskeletal: Normal range of motion.  Neurological: He is alert.  Skin: Skin is warm.  ED Course  Procedures (including critical care time) Labs Review Labs Reviewed  CBC - Abnormal; Notable for the following:    HCT 38.7 (*)    All other components within normal limits  BASIC METABOLIC PANEL  D-DIMER, QUANTITATIVE (NOT AT [facility])  Randolm Idol, ED    Imaging Review Dg Chest 2 View  12/22/2014   CLINICAL DATA:  Chest pain with cough for 1 day  EXAM:  CHEST  2 VIEW  COMPARISON:  June 22, 2011  FINDINGS: There are scattered small calcified granulomas. There is no edema or consolidation. Heart is upper normal in size with pulmonary vascularity within normal limits. No adenopathy. No bone lesions.  IMPRESSION: Scattered small calcified granulomas.  No edema or consolidation.   Electronically Signed   By: Lowella Grip III M.D.   On: 12/22/2014 20:02   I have personally reviewed and evaluated these images and lab results as part of my medical decision-making.   EKG Interpretation   Date/Time:  Thursday December 22 2014 18:32:45 EDT Ventricular Rate:  57 PR Interval:  162 QRS Duration: 86 QT Interval:  454 QTC Calculation: 441 R Axis:   33 Text Interpretation:  Sinus bradycardia Nonspecific T wave abnormality  Abnormal ECG Confirmed by Alvino Chapel  MD, Ovid Curd [phone]) on 12/22/2014  8:54:09 PM      MDM   Final diagnoses:  Chest pain, unspecified chest pain type    Patient with chest pain. EKG reassuring and initial enzymes negative. There may be a muscular component to this and that it is worse with certain movements, however when I press on his chest is not tender and cannot give me much of a history on his previous cath. Sounds like he is not followed up at all with cardiology for this. He continues to smoke. Will admit to internal medicine.    Davonna Belling, MD 12/22/14 [phone]

## 2014-12-22 NOTE — ED Notes (Addendum)
BP elevated. Patient takes Benicar for hypertension, but patient/spouse report patient has not taken BP medication today or last night.

## 2014-12-23 ENCOUNTER — Encounter (HOSPITAL_COMMUNITY): Payer: Self-pay | Admitting: Family Medicine

## 2014-12-23 ENCOUNTER — Encounter (HOSPITAL_COMMUNITY)
Admission: EM | Disposition: A | Discharge status: Home / Self Care | Payer: Self-pay | Source: Home / Self Care | Attending: Emergency Medicine

## 2014-12-23 DIAGNOSIS — I10 Essential (primary) hypertension: Secondary | ICD-10-CM

## 2014-12-23 DIAGNOSIS — I2510 Atherosclerotic heart disease of native coronary artery without angina pectoris: Secondary | ICD-10-CM | POA: Diagnosis present

## 2014-12-23 DIAGNOSIS — M79606 Pain in leg, unspecified: Secondary | ICD-10-CM

## 2014-12-23 DIAGNOSIS — R0789 Other chest pain: Secondary | ICD-10-CM

## 2014-12-23 DIAGNOSIS — I25110 Atherosclerotic heart disease of native coronary artery with unstable angina pectoris: Secondary | ICD-10-CM

## 2014-12-23 DIAGNOSIS — R079 Chest pain, unspecified: Secondary | ICD-10-CM | POA: Diagnosis present

## 2014-12-23 HISTORY — PX: CARDIAC CATHETERIZATION: SHX172

## 2014-12-23 LAB — TROPONIN I
Troponin I: <0.03 ng/mL (ref <0.031)
Troponin I: <0.03 ng/mL (ref <0.031)

## 2014-12-23 LAB — LIPASE, BLOOD: Lipase: 28 U/L (ref 22–51)

## 2014-12-23 SURGERY — LEFT HEART CATH AND CORONARY ANGIOGRAPHY
Anesthesia: LOCAL

## 2014-12-23 MED ORDER — MIDAZOLAM HCL 2 MG/2ML IJ SOLN
INTRAMUSCULAR | Status: AC
  Filled 2014-12-23: qty 4

## 2014-12-23 MED ORDER — SODIUM CHLORIDE 0.9 % IV SOLN: INTRAVENOUS | Status: AC

## 2014-12-23 MED ORDER — SODIUM CHLORIDE 0.9 % IJ SOLN
3.0000 mL | Freq: Two times a day (BID) | INTRAMUSCULAR | Status: DC
  Administered 2014-12-24: 3 mL via INTRAVENOUS

## 2014-12-23 MED ORDER — ACETAMINOPHEN 325 MG PO TABS
650.0000 mg | ORAL_TABLET | ORAL | Status: DC | PRN
  Administered 2014-12-23 – 2014-12-24 (×4): 650 mg via ORAL
  Filled 2014-12-23 (×4): qty 2

## 2014-12-23 MED ORDER — LIDOCAINE HCL (PF) 1 % IJ SOLN
INTRAMUSCULAR | Status: AC
  Filled 2014-12-23: qty 30

## 2014-12-23 MED ORDER — FENTANYL CITRATE (PF) 100 MCG/2ML IJ SOLN
INTRAMUSCULAR | Status: DC | PRN
  Administered 2014-12-23: 25 ug via INTRAVENOUS

## 2014-12-23 MED ORDER — SODIUM CHLORIDE 0.9 % IV SOLN: 250.0000 mL | INTRAVENOUS | Status: DC | PRN

## 2014-12-23 MED ORDER — MIDAZOLAM HCL 2 MG/2ML IJ SOLN
INTRAMUSCULAR | Status: DC | PRN
  Administered 2014-12-23: 1 mg via INTRAVENOUS

## 2014-12-23 MED ORDER — ASPIRIN EC 81 MG PO TBEC
81.0000 mg | DELAYED_RELEASE_TABLET | Freq: Every day | ORAL | Status: DC
  Administered 2014-12-23 – 2014-12-24 (×2): 81 mg via ORAL
  Filled 2014-12-23 (×2): qty 1

## 2014-12-23 MED ORDER — HEPARIN (PORCINE) IN NACL 2-0.9 UNIT/ML-% IJ SOLN
INTRAMUSCULAR | Status: DC | PRN
  Administered 2014-12-23: 14:00:00

## 2014-12-23 MED ORDER — SODIUM CHLORIDE 0.9 % IJ SOLN: 3.0000 mL | INTRAMUSCULAR | Status: DC | PRN

## 2014-12-23 MED ORDER — ONDANSETRON HCL 4 MG/2ML IJ SOLN: 4.0000 mg | Freq: Four times a day (QID) | INTRAMUSCULAR | Status: DC | PRN

## 2014-12-23 MED ORDER — FENTANYL CITRATE (PF) 100 MCG/2ML IJ SOLN
INTRAMUSCULAR | Status: AC
  Filled 2014-12-23: qty 4

## 2014-12-23 MED ORDER — SODIUM CHLORIDE 0.9 % IV SOLN: INTRAVENOUS | Status: DC

## 2014-12-23 MED ORDER — IOHEXOL 350 MG/ML SOLN
INTRAVENOUS | Status: DC | PRN
  Administered 2014-12-23: 55 mL via INTRA_ARTERIAL

## 2014-12-23 MED ORDER — SODIUM CHLORIDE 0.9 % IJ SOLN: 3.0000 mL | Freq: Two times a day (BID) | INTRAMUSCULAR | Status: DC

## 2014-12-23 MED ORDER — HEPARIN (PORCINE) IN NACL 2-0.9 UNIT/ML-% IJ SOLN
INTRAMUSCULAR | Status: AC
  Filled 2014-12-23: qty 1500

## 2014-12-23 SURGICAL SUPPLY — 8 items
CATH INFINITI 5FR JL5 (CATHETERS) ×1 IMPLANT
CATH INFINITI 5FR MULTPACK ANG (CATHETERS) ×2 IMPLANT
KIT HEART LEFT (KITS) ×2 IMPLANT
PACK CARDIAC CATHETERIZATION (CUSTOM PROCEDURE TRAY) ×2 IMPLANT
SHEATH PINNACLE 5F 10CM (SHEATH) ×2 IMPLANT
SYR MEDRAD MARK V 150ML (SYRINGE) ×2 IMPLANT
TRANSDUCER W/STOPCOCK (MISCELLANEOUS) ×2 IMPLANT
WIRE EMERALD 3MM-J .035X150CM (WIRE) ×2 IMPLANT

## 2014-12-23 NOTE — H&P (Signed)
History and Physical  Nicholas Carroll [id number]:[id number] DOB: 12-22-1950 DOA: 12/22/2014  Referring physician: Davonna Belling, MD PCP: No primary care provider on file.   Chief Complaint: Two days chest pain  HPI: Nicholas Carroll is a 64 y.o. 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  The patient is accompanied by his wife.  They report that he has developed sharp, progressively worse, constant chest pain, that is located substernally and across the chest and radiates to the back and shoulder, worsened with movement of the arm or deep breath, not with exertion.  This pain started two days ago and has been gradually getting worse until today.  He has had a recent cough and sore throat.  In the ED, the patient was given nitroglycerin which did not help.  His ECG showed sinus bradycardia without ST changes and an initial troponin was negative.   Review of Systems:  Patient seen 1145 on 12/22/14. Pt complains of chest pain as above, cough and sore throat mild, decreased appetite. Pt denies any palpitations, leg swelling.  Denies dyspnea, sputum, fever.  Denies pain with leaning back, relieved with leaning forward.  Otherwise, twelve systems were reviewed and were negative except as noted above in the history of present illness.  Past Medical History  Diagnosis Date  . Coronary artery disease   . Obesity   . Sexual dysfunction   . Hyperlipidemia   . Hypertension    History reviewed. No pertinent past surgical history. Social History:  reports that he has quit smoking. He does not have any smokeless tobacco history on file. He reports that he does not drink alcohol or use illicit drugs. Patient lives with his wife.  He drives a dumptruck.  No recent heavy lifting.  His smoking history is remote.  No Known Allergies  Family History  Problem Relation Age of Onset  .  Other Other     Pacemaker   No family history of premature cardiovascular disease.  Prior to Admission medications   Medication Sig Start Date End Date Taking? Authorizing Provider  aspirin 81 MG tablet Take 81 mg by mouth daily.   Yes Historical Provider, MD  Olmesartan   Physical Exam: BP 159/82 mmHg  Pulse 53  Temp(Src) 98 F (36.7 C) (Oral)  Resp 15  SpO2 99% General: Sitting in dark room, alert and in mild distress from pain.  Responds appropriately to questions.  Eye contact, dress and hygiene appropriate. HEENT: Corneas clear, conjunctivae and sclerae normal without injection or icterus, lids and lashes normal.  EOMI and PERRL.  Visual tracking smooth.  Nose normal.  OP moist without erythema, exudates, cobblestoning, or ulcers.  No airway deformities.  Neck supple.  No cervical lymphadenopathy. Cardiac: RRR, nl S1-S2, no murmurs, rubs, gallops.  Capillary refill is less than 2 seconds.  No lower extremity edema. Respiratory: Normal respiratory rate and rhythm.  CTAB without rales or wheezes. Abdomen: BS present.  No TTP or rebound all quadrants.  No masses or organomegaly.  No ascites, distension. Extremities: No deformities/injuries.  5/5 grip strength and upper extremity flexion/extension, symmetrically.  Extremities are warm and well-perfused. Neuro: Sensorium intact. Cranial nerves 3-12 intact. Speech is fluent.  Naming is grossly intact, and the patient's recall, recent and remote, as well as general fund of knowledge seem within normal limits.  Muscle tone normal, without fasciculations.  Moves all extremities  equally and with normal coordination.  Attention span and concentration are within normal limits.             Labs on Admission:  Notable for normal renal function, serum glucose. Potassium 3.5 mmol/L. No leukocytosis or anemia.  D-dimer negative Initial troponin negative     Radiological Exams on Admission: Dg Chest 2 View 12/22/2014    Personally  reviewed: no airway disease other than scattered granulomas.  No effusions.   EKG: Independently reviewed. Sinus bradycardia with mild flattening of T-waves since last ECG in system, no ST changes.  Assessment/Plan Present on Admission:  . Essential hypertension . Atypical chest pain . Coronary artery disease involving native coronary artery . Chest pain    1. Chest pain: The pain is not anginal in character and not exertional or relieved with rest.  It is mostly provoked by movement.  Given his previous coronary disease (critical stenosis of left main with PCI in 2011), we will nonetheless observe him overnight, obtain serial troponins and ask Cardiology to evaluate him in the morning.  Diet: NPO in the morning, for potential stress test  Consultants: Cardiology  Code Status: Full  Family Communication: Wife present at bedside       [facility] Pager [phone]

## 2014-12-23 NOTE — Progress Notes (Signed)
PROGRESS NOTE  SHANON BECVAR [id number]:[id number] DOB: 12-14-1950 DOA: 12/22/2014 PCP: No primary care Nubia Ziesmer on file.  Brief history 64 year old male with a history of CAD with history of CABG in 2011 presents with one-week history of intermittent chest discomfort that he describes as sharp in nature that occurs with rest as well as with activity. He states that taking a deep breath as well as coughing and any movement will also make it worse. 2 days prior to admission, his chest discomfort became more frequent and constant. As a result presented to the emergency department. The patient was given some nitroglycerin without much effect. EKG shows sinus rhythm with nonspecific T-wave changes. Troponins are negative 2. Chest x-ray showed calcified granulomas. Cardiology was consulted. Assessment/Plan: Atypical chest pain -Appreciate cardiology consult -Plans noted for heart catheterization 12/23/2014 -Finish cycle troponins -Troponins negative 2 -EKG--nonspecific T wave changes -Continue aspirin  -01/07/2011 Myoview negative for ischemia, EF 51%  Coronary artery disease with history of CABG  -Lipid panel in the morning  -Continue aspirin  Leg pain and edema  -Venous duplex r/o DVT   Family Communication:   Pt at beside Disposition Plan:   Home when medically stable        Procedures/Studies: Dg Chest 2 View  12/22/2014   CLINICAL DATA:  Chest pain with cough for 1 day  EXAM: CHEST  2 VIEW  COMPARISON:  June 22, 2011  FINDINGS: There are scattered small calcified granulomas. There is no edema or consolidation. Heart is upper normal in size with pulmonary vascularity within normal limits. No adenopathy. No bone lesions.  IMPRESSION: Scattered small calcified granulomas.  No edema or consolidation.   Electronically Signed   By: Lowella Grip III M.D.   On: 12/22/2014 20:02        Subjective: Patient continues to complain of intermittent chest discomfort with deep  inspiration and coughing. Denies any shortness breath, nausea, vomiting, dizziness. Denies any abdominal pain, hematuria, medication, melena.   Objective: Filed Vitals:   12/23/14 0150 12/23/14 0201 12/23/14 0646 12/23/14 0900  BP: 159/91  136/68 145/84  Pulse: 55  55   Temp: 98.4 F (36.9 C)  98.4 F (36.9 C) 97.8 F (36.6 C)  TempSrc: Oral  Oral Oral  Resp: 18  18   Height:  6\' 3"  (1.905 m)    SpO2: 99%  99% 97%    Intake/Output Summary (Last 24 hours) at 12/23/14 1241 Last data filed at 12/23/14 0851  Gross per 24 hour  Intake      0 ml  Output      0 ml  Net      0 ml   Weight change:  Exam:   General:  Pt is alert, follows commands appropriately, not in acute distress  HEENT: No icterus, No thrush, No neck mass, [place]/AT  Cardiovascular: RRR, S1/S2, no rubs, no gallops  Respiratory: CTA bilaterally, no wheezing, no crackles, no rhonchi  Abdomen: Soft/+BS, non tender, non distended, no guarding  Extremities: 1+LE edema, No lymphangitis, No petechiae, No rashes, no synovitis  Data Reviewed: Basic Metabolic Panel:  Recent Labs Lab 12/22/14 1852  NA 136  K 3.5  CL 104  CO2 24  GLUCOSE 87  BUN 9  CREATININE 1.13  CALCIUM 8.9   Liver Function Tests: No results for input(s): AST, ALT, ALKPHOS, BILITOT, PROT, ALBUMIN in the last 168 hours.  Recent Labs Lab 12/23/14 0320  LIPASE 28  No results for input(s): AMMONIA in the last 168 hours. CBC:  Recent Labs Lab 12/22/14 1852  WBC 5.5  HGB 13.4  HCT 38.7*  MCV 84.5  PLT 224   Cardiac Enzymes:  Recent Labs Lab 12/23/14 0320 12/23/14 0801  TROPONINI <0.03 <0.03   BNP: Invalid input(s): POCBNP CBG: No results for input(s): GLUCAP in the last 168 hours.  No results found for this or any previous visit (from the past 240 hour(s)).   Scheduled Meds: . aspirin EC  81 mg Oral Daily  . sodium chloride  3 mL Intravenous Q12H   Continuous Infusions: . sodium chloride       TAT, DAVID,  DO  [facility] Pager [phone]  If 7PM-7AM, please contact night-coverage [email or web address] Password [facility] 12/23/2014, 12:41 PM

## 2014-12-23 NOTE — Progress Notes (Signed)
Patient transferred to room 3E08 via stretcher from the ED> Oriented patient to heart failure floor and room equipment. VSS. Currently states chest pain is going down some but still states pain level a 7 out of 10 on pain scale. Patient states it hurts when he exhales. Offered Tylenol and patient stated he would take it. Will continue to monitor patient to end of shift.

## 2014-12-23 NOTE — Progress Notes (Signed)
Initial Nutrition Assessment  DOCUMENTATION CODES:   Obesity unspecified  INTERVENTION:  Monitor diet advancement and PO adequacy   NUTRITION DIAGNOSIS:   Predicted suboptimal nutrient intake related to poor appetite as evidenced by per patient/family report.   GOAL:   Patient will meet greater than or equal to 90% of their needs   MONITOR:   Diet advancement, PO intake, Weight trends, Labs, I & O's  REASON FOR ASSESSMENT:   Malnutrition Screening Tool    ASSESSMENT:   64 y.o. male with a past medical history significant for hypertension and obesity who presents with two days sharp substernal chest pain.  Patient states that he has had a poor appetite for the past 2 weeks do to chest pain. He usually eats 1 meal daily and 1-2 snacks and states he has been eating a little less at his meal. He thinks he has lost 3-5 lbs during this time. He states he has been trying to follow a low sodium low fat diet but, it has been challenging. RD answered patient's questions and affirmed pt's healthy habits. Provided "20 ways to eat more fruits and vegetables" handout and "Healthy snacks for Teenagers and Adults" handout from the Academy of [employer] and Dietetics. Pt denies any further questions or concerns at this time.   Labs reviewed.    Diet Order:  Diet clear liquid Room service appropriate?: Yes; Fluid consistency:: Thin  Skin:  Reviewed, no issues  Last BM:  9/14  Height:   Ht Readings from Last 1 Encounters:  12/23/14 6\' 3"  (1.905 m)    Weight:   Wt Readings from Last 1 Encounters:  12/22/07 262 lb (118.842 kg)    Ideal Body Weight:  89.1 kg  BMI:  There is no weight on file to calculate BMI.  Estimated Nutritional Needs:   Kcal:  1800-2100  Protein:  90-100 grams   Fluid:  >/= 2.1 L/day  EDUCATION NEEDS:   No education needs identified at this time  [place], LDN Inpatient Clinical Dietitian Pager: [phone] After Hours Pager: [phone]

## 2014-12-23 NOTE — Consult Note (Signed)
Referring Physician:  RYSHAWN SANZONE is an 64 y.o. male.                       Chief Complaint: Chest pain  HPI: 64 y.o. 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 EKG showed sinus bradycardia with non-specific T wave changes. Troponin I was normal x 2.  Past Medical History  Diagnosis Date  . Coronary artery disease   . Obesity   . Sexual dysfunction   . Hyperlipidemia   . Hypertension       History reviewed. No pertinent past surgical history.  Family History  Problem Relation Age of Onset  . Other Other     Pacemaker   Social History:  reports that he has quit smoking. He does not have any smokeless tobacco history on file. He reports that he does not drink alcohol or use illicit drugs.  Allergies: No Known Allergies  Medications Prior to Admission  Medication Sig Dispense Refill  . aspirin 81 MG tablet Take 81 mg by mouth daily.      Results for orders placed or performed during the hospital encounter of 12/22/14 (from the past 48 hour(s))  Basic metabolic panel     Status: None   Collection Time: 12/22/14  6:52 PM  Result Value Ref Range   Sodium 136 135 - 145 mmol/L   Potassium 3.5 3.5 - 5.1 mmol/L   Chloride 104 101 - 111 mmol/L   CO2 24 22 - 32 mmol/L   Glucose, Bld 87 65 - 99 mg/dL   BUN 9 6 - 20 mg/dL   Creatinine, Ser 1.13 0.61 - 1.24 mg/dL   Calcium 8.9 8.9 - 10.3 mg/dL   GFR calc non Af Amer >60 >60 mL/min   GFR calc Af Amer >60 >60 mL/min    Comment: (NOTE) The eGFR has been calculated using the CKD EPI equation. This calculation has not been validated in all clinical situations. eGFR's persistently <60 mL/min signify possible Chronic Kidney Disease.    Anion gap 8 5 - 15  CBC     Status: Abnormal   Collection Time: 12/22/14  6:52 PM  Result Value Ref Range   WBC 5.5 4.0 - 10.5 K/uL   RBC 4.58 4.22 - 5.81 MIL/uL   Hemoglobin  13.4 13.0 - 17.0 g/dL   HCT 38.7 (L) 39.0 - 52.0 %   MCV 84.5 78.0 - 100.0 fL   MCH 29.3 26.0 - 34.0 pg   MCHC 34.6 30.0 - 36.0 g/dL   RDW 14.1 11.5 - 15.5 %   Platelets 224 150 - 400 K/uL  D-dimer, quantitative (not at [facility])     Status: None   Collection Time: 12/22/14  6:52 PM  Result Value Ref Range   D-Dimer, Quant 0.37 0.00 - 0.48 ug/mL-FEU    Comment:        AT THE INHOUSE ESTABLISHED CUTOFF VALUE OF 0.48 ug/mL FEU, THIS ASSAY HAS BEEN DOCUMENTED IN THE LITERATURE TO HAVE A SENSITIVITY AND NEGATIVE PREDICTIVE VALUE OF AT LEAST 98 TO 99%.  THE TEST RESULT SHOULD BE CORRELATED WITH AN ASSESSMENT OF THE CLINICAL PROBABILITY OF DVT / VTE.   I-stat troponin, ED     Status: None   Collection Time: 12/22/14  7:13 PM  Result Value Ref Range   Troponin i, poc 0.01 0.00 - 0.08  ng/mL   Comment 3            Comment: Due to the release kinetics of cTnI, a negative result within the first hours of the onset of symptoms does not rule out myocardial infarction with certainty. If myocardial infarction is still suspected, repeat the test at appropriate intervals.   Lipase, blood     Status: None   Collection Time: 12/23/14  3:20 AM  Result Value Ref Range   Lipase 28 22 - 51 U/L  Troponin I-serum (0, 3, 6 hours)     Status: None   Collection Time: 12/23/14  3:20 AM  Result Value Ref Range   Troponin I <0.03 <0.031 ng/mL    Comment:        NO INDICATION OF MYOCARDIAL INJURY.   Troponin I-serum (0, 3, 6 hours)     Status: None   Collection Time: 12/23/14  8:01 AM  Result Value Ref Range   Troponin I <0.03 <0.031 ng/mL    Comment:        NO INDICATION OF MYOCARDIAL INJURY.    Dg Chest 2 View  12/22/2014   CLINICAL DATA:  Chest pain with cough for 1 day  EXAM: CHEST  2 VIEW  COMPARISON:  June 22, 2011  FINDINGS: There are scattered small calcified granulomas. There is no edema or consolidation. Heart is upper normal in size with pulmonary vascularity within normal limits. No  adenopathy. No bone lesions.  IMPRESSION: Scattered small calcified granulomas.  No edema or consolidation.   Electronically Signed   By: Lowella Grip III M.D.   On: 12/22/2014 20:02    Review Of Systems Constitutional: Negative for activity change and appetite change.  Eyes: Negative for pain.  Respiratory: Positive for shortness of breath. Negative for chest tightness.  Cardiovascular: Positive for chest pain. Negative for leg swelling.  Gastrointestinal: Negative for nausea, vomiting, abdominal pain and diarrhea.  Genitourinary: Negative for flank pain.  Musculoskeletal: Negative for back pain and neck stiffness.  Skin: Negative for rash.  Neurological: Negative for weakness, numbness and headaches.  Psychiatric/Behavioral: Negative for behavioral problems.   Blood pressure 145/84, pulse 55, temperature 97.8 F (36.6 C), temperature source Oral, resp. rate 18, height '6\' 3"'  (1.905 m), SpO2 97 %. Physical Exam  Constitutional: Well built and nourished.  HENT: [place]/AT. Brown eyes, conj-pink, Sclera-non-icteric Neck: Neck supple.  Cardiovascular: Normal rate, S1  and S2 normal .II/VI systolic murmur.  Pulmonary/Chest: He exhibits no tenderness. Clear to auscultation. Abdomen: Soft.  Musculoskeletal: Normal range of motion.  Neurological: He is alert. Moves all 4 extremities Skin: Skin is warm.   Assessment/Plan CAD S/P PTCA LAD Obesity Hyperlipidemia  Cardiac cath today.  Birdie Riddle, MD  12/23/2014, 12:07 PM

## 2014-12-23 NOTE — Progress Notes (Signed)
Site area: rt groin fa sheath removed and pressure held by Nelda Severe Site Prior to Removal:  Level  0 Pressure Applied For:  20 minutes Manual:   yes Patient Status During Pull:  stable Post Pull Site:  Level  0 Post Pull Instructions Given:  yes Post Pull Pulses Present: yes Dressing Applied:  tegaderm Bedrest begins @  1500 Comments:   0

## 2014-12-24 ENCOUNTER — Observation Stay (HOSPITAL_BASED_OUTPATIENT_CLINIC_OR_DEPARTMENT_OTHER): Payer: BC Managed Care – PPO

## 2014-12-24 DIAGNOSIS — I25110 Atherosclerotic heart disease of native coronary artery with unstable angina pectoris: Secondary | ICD-10-CM | POA: Diagnosis not present

## 2014-12-24 DIAGNOSIS — I10 Essential (primary) hypertension: Secondary | ICD-10-CM | POA: Diagnosis not present

## 2014-12-24 DIAGNOSIS — Z951 Presence of aortocoronary bypass graft: Secondary | ICD-10-CM | POA: Diagnosis not present

## 2014-12-24 DIAGNOSIS — I25119 Atherosclerotic heart disease of native coronary artery with unspecified angina pectoris: Secondary | ICD-10-CM | POA: Diagnosis not present

## 2014-12-24 DIAGNOSIS — R0789 Other chest pain: Secondary | ICD-10-CM | POA: Diagnosis not present

## 2014-12-24 DIAGNOSIS — I2510 Atherosclerotic heart disease of native coronary artery without angina pectoris: Secondary | ICD-10-CM | POA: Diagnosis not present

## 2014-12-24 DIAGNOSIS — M79606 Pain in leg, unspecified: Secondary | ICD-10-CM | POA: Diagnosis not present

## 2014-12-24 LAB — CBC
HEMATOCRIT: 37.9 % — AB (ref 39.0–52.0)
HEMOGLOBIN: 13.1 g/dL (ref 13.0–17.0)
MCH: 29.4 pg (ref 26.0–34.0)
MCHC: 34.6 g/dL (ref 30.0–36.0)
MCV: 85.2 fL (ref 78.0–100.0)
Platelets: 203 10*3/uL (ref 150–400)
RBC: 4.45 MIL/uL (ref 4.22–5.81)
RDW: 14.2 % (ref 11.5–15.5)
WBC: 4.8 10*3/uL (ref 4.0–10.5)

## 2014-12-24 LAB — BASIC METABOLIC PANEL
ANION GAP: 6 (ref 5–15)
BUN: 6 mg/dL (ref 6–20)
CO2: 27 mmol/L (ref 22–32)
Calcium: 8.8 mg/dL — ABNORMAL LOW (ref 8.9–10.3)
Chloride: 103 mmol/L (ref 101–111)
Creatinine, Ser: 0.99 mg/dL (ref 0.61–1.24)
Glucose, Bld: 94 mg/dL (ref 65–99)
POTASSIUM: 3.5 mmol/L (ref 3.5–5.1)
SODIUM: 136 mmol/L (ref 135–145)

## 2014-12-24 MED ORDER — ATORVASTATIN CALCIUM 20 MG PO TABS: 20.0000 mg | ORAL_TABLET | Freq: Every day | ORAL | Status: DC

## 2014-12-24 NOTE — Progress Notes (Signed)
Patient is discharge to home accompanied by RN and spouse via wheelchair.Patient discharge instructions given. Patient verbalized understanding. No apparent distress noted. Patient's stable. All personal belongings given.

## 2014-12-24 NOTE — Progress Notes (Signed)
VASCULAR LAB PRELIMINARY  PRELIMINARY  PRELIMINARY  PRELIMINARY  Bilateral lower extremity venous duplex completed.    Preliminary report:  There is no DVT or SVT noted in the bilateral lower extremities.   KANADY, CANDACE, RVT 12/24/2014, 9:53 AM

## 2014-12-24 NOTE — Discharge Summary (Addendum)
Physician Discharge Summary  Nicholas Carroll VXB:[id number] DOB: 24-Nov-1950 DOA: 12/22/2014  PCP: No primary care provider on file.  Admit date: 12/22/2014 Discharge date: 12/24/2014  Recommendations for Outpatient Follow-up:  1. Pt will need to follow up with PCP in 2 weeks post discharge 2. Please obtain BMP in one week Discharge Diagnoses:  Atypical chest pain -Appreciate cardiology consult -Plans noted for heart catheterization 12/23/2014 -Troponins negative 2 -EKG--nonspecific T wave changes -Continue aspirin  -01/07/2011 Myoview negative for ischemia, EF 51%  -12/23/14--pt underwent heart catherization which did not reveal any clinically significant obstructive disease -D-Dimer--0.37 (neg) Coronary artery disease with history of CABG  -Continue aspirin  -start atorvastatin Leg pain and edema  -Venous duplex r/o DVT--negative Essential Hypertension -pt states that he takes Benicar which was not noted on Med Rec -instructed to restart Benicar and follow up with PCP for further adjustment of anti-HTN regimen  Discharge Condition: stable  Disposition:  Follow-up Information    Follow up with [facility] S, MD In 1 week.   Specialty:  Cardiology   Contact information:   [place] [id number] [phone]     home  Diet:heart healthy Wt Readings from Last 3 Encounters:  12/24/14 115.758 kg (255 lb 3.2 oz)  12/22/07 118.842 kg (262 lb)    History of present illness:  64 year old male with a history of CAD with history of CABG in 2011 presents with one-week history of intermittent chest discomfort that he describes as sharp in nature that occurs with rest as well as with activity. He states that taking a deep breath as well as coughing and any movement will also make it worse. 2 days prior to admission, his chest discomfort became more frequent and constant. As a result presented to the emergency department. The patient was given some  nitroglycerin without much effect. EKG shows sinus rhythm with nonspecific T-wave changes. Troponins are negative 2. Chest x-ray showed calcified granulomas. Cardiology was consulted. Dr. Doylene Canard took patient for heart catheterrization on 12/23/14 which did not show any clinically significant obstructive disease. The patient was noted to be hypertensive. He stated that he was supposed to be taking Benicar which was not noted to be on his medication list in Epic. The patient was instructed to restart his Benicar. In addition, the patient was instructed to continue his aspirin. He was started on atorvastatin given his history of CABG and coronary artery disease.    Consultants: Cardiology--[place]  Discharge Exam: Filed Vitals:   12/24/14 0631  BP: 149/85  Pulse: 56  Temp: 97.8 F (36.6 C)  Resp: 18   Filed Vitals:   12/23/14 1537 12/23/14 2023 12/23/14 2325 12/24/14 0631  BP: 175/94 169/95 159/86 149/85  Pulse: 51 49 55 56  Temp: 98.4 F (36.9 C) 97.7 F (36.5 C)  97.8 F (36.6 C)  TempSrc: Oral Oral  Oral  Resp:  18 18 18   Height:      Weight:    115.758 kg (255 lb 3.2 oz)  SpO2: 99% 98% 98% 98%   General: A&O x 3, NAD, pleasant, cooperative Cardiovascular: RRR, no rub, no gallop, no S3 Respiratory: CTAB, no wheeze, no rhonchi Abdomen:soft, nontender, nondistended, positive bowel sounds Extremities: 1+ LE edema, No lymphangitis, no petechiae  Discharge Instructions      Discharge Instructions    Diet - low sodium heart healthy    Complete by:  As directed      Increase activity slowly    Complete by:  As  directed             Medication List    TAKE these medications        aspirin 81 MG tablet  Take 81 mg by mouth daily.     atorvastatin 20 MG tablet  Commonly known as:  LIPITOR  Take 1 tablet (20 mg total) by mouth daily.         The results of significant diagnostics from this hospitalization (including imaging, microbiology, ancillary and laboratory)  are listed below for reference.    Significant Diagnostic Studies: Dg Chest 2 View  12/22/2014   CLINICAL DATA:  Chest pain with cough for 1 day  EXAM: CHEST  2 VIEW  COMPARISON:  June 22, 2011  FINDINGS: There are scattered small calcified granulomas. There is no edema or consolidation. Heart is upper normal in size with pulmonary vascularity within normal limits. No adenopathy. No bone lesions.  IMPRESSION: Scattered small calcified granulomas.  No edema or consolidation.   Electronically Signed   By: Lowella Grip III M.D.   On: 12/22/2014 20:02     Microbiology: No results found for this or any previous visit (from the past 240 hour(s)).   Labs: Basic Metabolic Panel:  Recent Labs Lab 12/22/14 1852 12/24/14 0350  NA 136 136  K 3.5 3.5  CL 104 103  CO2 24 27  GLUCOSE 87 94  BUN 9 6  CREATININE 1.13 0.99  CALCIUM 8.9 8.8*   Liver Function Tests: No results for input(s): AST, ALT, ALKPHOS, BILITOT, PROT, ALBUMIN in the last 168 hours.  Recent Labs Lab 12/23/14 0320  LIPASE 28   No results for input(s): AMMONIA in the last 168 hours. CBC:  Recent Labs Lab 12/22/14 1852 12/24/14 0350  WBC 5.5 4.8  HGB 13.4 13.1  HCT 38.7* 37.9*  MCV 84.5 85.2  PLT 224 203   Cardiac Enzymes:  Recent Labs Lab 12/23/14 0320 12/23/14 0801  TROPONINI <0.03 <0.03   BNP: Invalid input(s): POCBNP CBG: No results for input(s): GLUCAP in the last 168 hours.  Time coordinating discharge:  Greater than 30 minutes  Signed:  Ausencio Vaden, DO [facility] Pager: [phone] 12/24/2014, 11:10 AM

## 2014-12-26 ENCOUNTER — Encounter (HOSPITAL_COMMUNITY): Payer: Self-pay | Admitting: Cardiovascular Disease

## 2015-12-23 ENCOUNTER — Emergency Department (HOSPITAL_COMMUNITY): Payer: BC Managed Care – PPO

## 2015-12-23 ENCOUNTER — Encounter (HOSPITAL_COMMUNITY): Payer: Self-pay

## 2015-12-23 ENCOUNTER — Emergency Department (HOSPITAL_COMMUNITY)
Admission: EM | Admit: 2015-12-23 | Discharge: 2015-12-23 | Disposition: A | Discharge status: Home / Self Care | Payer: BC Managed Care – PPO | Attending: Emergency Medicine | Admitting: Emergency Medicine

## 2015-12-23 DIAGNOSIS — Z7982 Long term (current) use of aspirin: Secondary | ICD-10-CM | POA: Insufficient documentation

## 2015-12-23 DIAGNOSIS — R109 Unspecified abdominal pain: Secondary | ICD-10-CM | POA: Diagnosis not present

## 2015-12-23 DIAGNOSIS — Z87891 Personal history of nicotine dependence: Secondary | ICD-10-CM | POA: Insufficient documentation

## 2015-12-23 DIAGNOSIS — I2510 Atherosclerotic heart disease of native coronary artery without angina pectoris: Secondary | ICD-10-CM | POA: Diagnosis not present

## 2015-12-23 DIAGNOSIS — Z955 Presence of coronary angioplasty implant and graft: Secondary | ICD-10-CM | POA: Insufficient documentation

## 2015-12-23 DIAGNOSIS — I10 Essential (primary) hypertension: Secondary | ICD-10-CM | POA: Diagnosis not present

## 2015-12-23 DIAGNOSIS — N4400 Torsion of testis, unspecified: Secondary | ICD-10-CM

## 2015-12-23 DIAGNOSIS — N410 Acute prostatitis: Secondary | ICD-10-CM | POA: Diagnosis not present

## 2015-12-23 DIAGNOSIS — N50811 Right testicular pain: Secondary | ICD-10-CM | POA: Diagnosis present

## 2015-12-23 LAB — CBC WITH DIFFERENTIAL/PLATELET
BASOS ABS: 0 10*3/uL (ref 0.0–0.1)
BASOS PCT: 0 %
EOS ABS: 0.4 10*3/uL (ref 0.0–0.7)
EOS PCT: 8 %
HEMATOCRIT: 42.1 % (ref 39.0–52.0)
Hemoglobin: 14.1 g/dL (ref 13.0–17.0)
Lymphocytes Relative: 52 %
Lymphs Abs: 2.5 10*3/uL (ref 0.7–4.0)
MCH: 29 pg (ref 26.0–34.0)
MCHC: 33.5 g/dL (ref 30.0–36.0)
MCV: 86.6 fL (ref 78.0–100.0)
Monocytes Absolute: 0.4 10*3/uL (ref 0.1–1.0)
Monocytes Relative: 9 %
Neutro Abs: 1.5 10*3/uL — ABNORMAL LOW (ref 1.7–7.7)
Neutrophils Relative %: 31 %
PLATELETS: 218 10*3/uL (ref 150–400)
RBC: 4.86 MIL/uL (ref 4.22–5.81)
RDW: 14.5 % (ref 11.5–15.5)
WBC: 4.7 10*3/uL (ref 4.0–10.5)

## 2015-12-23 LAB — URINE MICROSCOPIC-ADD ON
Bacteria, UA: NONE SEEN
SQUAMOUS EPITHELIAL / LPF: NONE SEEN

## 2015-12-23 LAB — COMPREHENSIVE METABOLIC PANEL
ALBUMIN: 3.6 g/dL (ref 3.5–5.0)
ALK PHOS: 61 U/L (ref 38–126)
ALT: 28 U/L (ref 17–63)
AST: 35 U/L (ref 15–41)
Anion gap: 8 (ref 5–15)
BUN: 10 mg/dL (ref 6–20)
CALCIUM: 9.3 mg/dL (ref 8.9–10.3)
CO2: 25 mmol/L (ref 22–32)
CREATININE: 1.16 mg/dL (ref 0.61–1.24)
Chloride: 105 mmol/L (ref 101–111)
GFR calc non Af Amer: >60 mL/min (ref >60)
GLUCOSE: 118 mg/dL — AB (ref 65–99)
Potassium: 4.1 mmol/L (ref 3.5–5.1)
SODIUM: 138 mmol/L (ref 135–145)
Total Bilirubin: 0.9 mg/dL (ref 0.3–1.2)
Total Protein: 7.1 g/dL (ref 6.5–8.1)

## 2015-12-23 LAB — I-STAT CG4 LACTIC ACID, ED: Lactic Acid, Venous: 1.07 mmol/L (ref 0.5–1.9)

## 2015-12-23 LAB — URINALYSIS, ROUTINE W REFLEX MICROSCOPIC
BILIRUBIN URINE: NEGATIVE
Glucose, UA: NEGATIVE mg/dL
KETONES UR: NEGATIVE mg/dL
Leukocytes, UA: NEGATIVE
Nitrite: NEGATIVE
PROTEIN: NEGATIVE mg/dL
Specific Gravity, Urine: 1.005 (ref 1.005–1.030)
pH: 5.5 (ref 5.0–8.0)

## 2015-12-23 LAB — POC OCCULT BLOOD, ED: Fecal Occult Bld: NEGATIVE

## 2015-12-23 MED ORDER — HYDROCODONE-ACETAMINOPHEN 5-325 MG PO TABS: 1.0000 | ORAL_TABLET | Freq: Four times a day (QID) | ORAL | 0 refills | Status: DC | PRN

## 2015-12-23 MED ORDER — HYDROMORPHONE HCL 1 MG/ML IJ SOLN
1.0000 mg | Freq: Once | INTRAMUSCULAR | Status: AC
  Administered 2015-12-23: 1 mg via INTRAVENOUS
  Filled 2015-12-23: qty 1

## 2015-12-23 MED ORDER — CIPROFLOXACIN HCL 500 MG PO TABS: 500.0000 mg | ORAL_TABLET | Freq: Two times a day (BID) | ORAL | 0 refills | Status: AC

## 2015-12-23 MED ORDER — IOPAMIDOL (ISOVUE-300) INJECTION 61%
INTRAVENOUS | Status: AC
  Administered 2015-12-23: 100 mL
  Filled 2015-12-23: qty 100

## 2015-12-23 MED ORDER — NAPROXEN 375 MG PO TABS: 375.0000 mg | ORAL_TABLET | Freq: Two times a day (BID) | ORAL | 0 refills | Status: DC | PRN

## 2015-12-23 MED ORDER — ONDANSETRON HCL 4 MG/2ML IJ SOLN
4.0000 mg | Freq: Once | INTRAMUSCULAR | Status: AC
  Administered 2015-12-23: 4 mg via INTRAVENOUS
  Filled 2015-12-23: qty 2

## 2015-12-23 NOTE — ED Notes (Signed)
Patient transported to Ultrasound 

## 2015-12-23 NOTE — ED Provider Notes (Signed)
[facility] DEPT Provider Note   CSN: [id number] Arrival date & time: 12/23/15  0800     History   Chief Complaint Chief Complaint  Patient presents with  . Testicle Pain    HPI Nicholas Carroll is a 65 y.o. male.  HPI 65 year old male with past medical history of hypertension, hyperlipidemia, coronary artery disease who presents with a several week history of perineal pain. The patient states that over the last several weeks he has had a dull, aching, throbbing, perineal pain that intermittently radiates into his testicles. Denies any associated dysuria. Earlier today the pain worsened and woke him up from sleep. He simply decided to present to the ED for evaluation. Denies any fevers or chills. He has had some nausea. He has also had some intermittent loose stools that he says are occasionally bloody. He is not on blood thinners. No chest pain other medical complaints  Past Medical History:  Diagnosis Date  . Coronary artery disease   . Hyperlipidemia   . Hypertension   . Obesity   . Sexual dysfunction     Patient Active Problem List   Diagnosis Date Noted  . Essential hypertension 12/23/2014  . Atypical chest pain 12/23/2014  . Coronary artery disease involving native coronary artery 12/23/2014  . Chest pain 12/23/2014  . Leg pain     Past Surgical History:  Procedure Laterality Date  . CARDIAC CATHETERIZATION N/A 12/23/2014   Procedure: Left Heart Cath and Coronary Angiography;  Surgeon: Dixie Dials, MD;  Location: [facility];  Service: Cardiovascular;  Laterality: N/A;       Home Medications    Prior to Admission medications   Medication Sig Start Date End Date Taking? Authorizing Provider  aspirin 81 MG tablet Take 81 mg by mouth daily.    Historical Provider, MD  atorvastatin (LIPITOR) 20 MG tablet Take 1 tablet (20 mg total) by mouth daily. 12/24/14   Orson Eva, MD  ciprofloxacin (CIPRO) 500 MG tablet Take 1 tablet (500 mg total) by mouth 2 (two)  times daily. 12/23/15 01/06/16  Duffy Bruce, MD  HYDROcodone-acetaminophen (NORCO/VICODIN) 5-325 MG tablet Take 1 tablet by mouth every 6 (six) hours as needed for severe pain. 12/23/15   Duffy Bruce, MD  naproxen (NAPROSYN) 375 MG tablet Take 1 tablet (375 mg total) by mouth 2 (two) times daily as needed for moderate pain. 12/23/15   Duffy Bruce, MD    Family History Family History  Problem Relation Age of Onset  . Other Other     Pacemaker    Social History Social History  Substance Use Topics  . Smoking status: Former Research scientist (life sciences)  . Smokeless tobacco: Never Used  . Alcohol use No     Allergies   Review of patient's allergies indicates no known allergies.   Review of Systems Review of Systems  Constitutional: Negative for chills, fatigue and fever.  HENT: Negative for congestion and rhinorrhea.   Eyes: Negative for visual disturbance.  Respiratory: Negative for cough, shortness of breath and wheezing.   Cardiovascular: Negative for chest pain and leg swelling.  Gastrointestinal: Positive for abdominal pain, anal bleeding and rectal pain. Negative for diarrhea, nausea and vomiting.  Genitourinary: Negative for dysuria and flank pain.  Musculoskeletal: Negative for neck pain and neck stiffness.  Skin: Negative for rash and wound.  Allergic/Immunologic: Negative for immunocompromised state.  Neurological: Negative for syncope, weakness and headaches.  All other systems reviewed and are negative.    Physical Exam Updated Vital Signs  BP 168/97 (BP Location: Left Arm)   Pulse 61   Temp 98.5 F (36.9 C) (Oral)   Resp 14   SpO2 95%   Physical Exam  Constitutional: He is oriented to person, place, and time. He appears well-developed and well-nourished. No distress.  HENT:  Head: Normocephalic and atraumatic.  Eyes: Conjunctivae are normal.  Neck: Neck supple.  Cardiovascular: Normal rate, regular rhythm and normal heart sounds.  Exam reveals no friction rub.   No  murmur heard. Pulmonary/Chest: Effort normal and breath sounds normal. No respiratory distress. He has no wheezes. He has no rales.  Abdominal: He exhibits no distension.  Genitourinary:  Genitourinary Comments: Testes descended bilaterally with normal cremasteric reflex. No epididymal tenderness. No apparent scrotal cellulitis or abnormalities. On rectal exam, patient has boggy, tender, enlarged prostate. Hemoccult negative  Musculoskeletal: He exhibits no edema.  Neurological: He is alert and oriented to person, place, and time. He exhibits normal muscle tone.  Skin: Skin is warm. Capillary refill takes less than 2 seconds.  Psychiatric: He has a normal mood and affect.  Nursing note and vitals reviewed.    ED Treatments / Results  Labs (all labs ordered are listed, but only abnormal results are displayed) Labs Reviewed  URINALYSIS, ROUTINE W REFLEX MICROSCOPIC (NOT AT [facility]) - Abnormal; Notable for the following:       Result Value   Hgb urine dipstick TRACE (*)    All other components within normal limits  CBC WITH DIFFERENTIAL/PLATELET - Abnormal; Notable for the following:    Neutro Abs 1.5 (*)    All other components within normal limits  COMPREHENSIVE METABOLIC PANEL - Abnormal; Notable for the following:    Glucose, Bld 118 (*)    All other components within normal limits  URINE MICROSCOPIC-ADD ON - Abnormal; Notable for the following:    Casts WAXY CAST (*)    All other components within normal limits  I-STAT CG4 LACTIC ACID, ED  POC OCCULT BLOOD, ED    EKG  EKG Interpretation None       Radiology US Scrotum  Result Date: 12/23/2015 CLINICAL DATA:  Patient with left testicular pain for 3 weeks. EXAM: SCROTAL ULTRASOUND DOPPLER ULTRASOUND OF THE TESTICLES TECHNIQUE: Complete ultrasound examination of the testicles, epididymis, and other scrotal structures was performed. Color and spectral Doppler ultrasound were also utilized to evaluate blood flow to the  testicles. COMPARISON:  None. FINDINGS: Right testicle Measurements: 4.3 x 2.0 x 2.6 cm. No mass or microlithiasis visualized. Left testicle Measurements: 3.5 x 2.2 x 2.7 cm the this. No mass or microlithiasis visualized. Right epididymis:  Normal in size and appearance. Left epididymis:  Normal in size and appearance. Hydrocele:  Small bilateral hydroceles. Varicocele:  None visualized. Pulsed Doppler interrogation of both testes demonstrates normal low resistance arterial and venous waveforms bilaterally. IMPRESSION: Normal sonographic appearance of the testicles bilaterally. Electronically Signed   By: Lovey Newcomer M.D.   On: 12/23/2015 10:11   Ct Abdomen Pelvis W Contrast  Result Date: 12/23/2015 CLINICAL DATA:  Patient with nausea and abdominal pain for 3 weeks, worsening today. EXAM: CT ABDOMEN AND PELVIS WITH CONTRAST TECHNIQUE: Multidetector CT imaging of the abdomen and pelvis was performed using the standard protocol following bolus administration of intravenous contrast. CONTRAST:  116mL ISOVUE-300 IOPAMIDOL (ISOVUE-300) INJECTION 61% COMPARISON:  Ultrasound abdomen 12/23/2015 FINDINGS: Lower chest: Normal heart size. Stable 3 mm right lower lobe nodule (image 11; series 3) dating back to 2014, compatible with benign etiology. Stable calcified  subpleural 4 mm nodule left lower lobe (image 7; series 3) and image 9; series 3). No pleural effusion. Minimal dependent atelectasis. Hepatobiliary: Liver is normal in size and contour. No focal lesion identified. Gallbladder is unremarkable. Pancreas: Unremarkable Spleen: Calcified granulomata in the spleen. Adrenals/Urinary Tract: Normal adrenal glands. Kidneys enhance symmetrically with contrast. No hydronephrosis. Urinary bladder is unremarkable. Stomach/Bowel: No abnormal bowel wall thickening or evidence for bowel obstruction. No free fluid or free intraperitoneal air. Normal appendix. Small hiatal hernia. Normal morphology of the stomach. No free fluid or  free intraperitoneal air. Vascular/Lymphatic: Normal caliber abdominal aorta. No retroperitoneal lymphadenopathy. Peripheral calcified atherosclerotic plaque. Reproductive: Central dystrophic calcifications in the prostate. Other: No abdominal wall hernia or abnormality. No abdominopelvic ascites. Musculoskeletal: Lumbar spine degenerative changes. No aggressive or acute appearing osseous lesions. IMPRESSION: No acute process within the abdomen or pelvis. Calcified granulomata within the lung bases and spleen. Electronically Signed   By: Lovey Newcomer M.D.   On: 12/23/2015 11:05   [place] Art/ven Flow Abd Pelv Doppler  Result Date: 12/23/2015 CLINICAL DATA:  Patient with left testicular pain for 3 weeks. EXAM: SCROTAL ULTRASOUND DOPPLER ULTRASOUND OF THE TESTICLES TECHNIQUE: Complete ultrasound examination of the testicles, epididymis, and other scrotal structures was performed. Color and spectral Doppler ultrasound were also utilized to evaluate blood flow to the testicles. COMPARISON:  None. FINDINGS: Right testicle Measurements: 4.3 x 2.0 x 2.6 cm. No mass or microlithiasis visualized. Left testicle Measurements: 3.5 x 2.2 x 2.7 cm the this. No mass or microlithiasis visualized. Right epididymis:  Normal in size and appearance. Left epididymis:  Normal in size and appearance. Hydrocele:  Small bilateral hydroceles. Varicocele:  None visualized. Pulsed Doppler interrogation of both testes demonstrates normal low resistance arterial and venous waveforms bilaterally. IMPRESSION: Normal sonographic appearance of the testicles bilaterally. Electronically Signed   By: Lovey Newcomer M.D.   On: 12/23/2015 10:11    Procedures Procedures (including critical care time)  Medications Ordered in ED Medications  HYDROmorphone (DILAUDID) injection 1 mg (1 mg Intravenous Given 12/23/15 1055)  ondansetron (ZOFRAN) injection 4 mg (4 mg Intravenous Given 12/23/15 1054)  iopamidol (ISOVUE-300) 61 % injection (100 mLs  Contrast  Given 12/23/15 1020)     Initial Impression / Assessment and Plan / ED Course  I have reviewed the triage vital signs and the nursing notes.  Pertinent labs & imaging results that were available during my care of the patient were reviewed by me and considered in my medical decision making (see chart for details).  Clinical Course    65 year old male with past medical history as above who presents with a several week history of intermittent rectal and perineal pain. No fevers or chills. No dysuria. On exam, patient notable to have a large tender boggy prostate. Initial lab work obtained and is overall unremarkable. Urinalysis shows no evidence of UTI. CBC is without leukocytosis. BMP shows normal renal function. Scrotal ultrasound obtained given reported testicular tenderness and shows no evidence of torsion, epididymitis, or abscess. Given his significant tenderness for 2 weeks, CT also obtained and shows no evidence of obstruction or other abnormality. Will treat as prostatitis with Cipro 500 twice a day for 2 weeks, and outpatient urology follow-up. He is otherwise afebrile, well-appearing, without signs of sepsis.  Final Clinical Impressions(s) / ED Diagnoses   Final diagnoses:  Acute prostatitis    New Prescriptions Discharge Medication List as of 12/23/2015 11:15 AM    START taking these medications   Details  ciprofloxacin (  CIPRO) 500 MG tablet Take 1 tablet (500 mg total) by mouth 2 (two) times daily., Starting Sat 12/23/2015, Until Sat 01/06/2016, Print    HYDROcodone-acetaminophen (NORCO/VICODIN) 5-325 MG tablet Take 1 tablet by mouth every 6 (six) hours as needed for severe pain., Starting Sat 12/23/2015, Print    naproxen (NAPROSYN) 375 MG tablet Take 1 tablet (375 mg total) by mouth 2 (two) times daily as needed for moderate pain., Starting Sat 12/23/2015, Print         Duffy Bruce, MD 12/23/15 1754

## 2015-12-23 NOTE — ED Triage Notes (Signed)
Patient complains of testicle pain x 1 week, states pain woke him this am but is resolving on assessment. Denies urinary symptoms, denies abdominal pain. Thinks he may have seen some blood in his stool

## 2018-06-07 DIAGNOSIS — R079 Chest pain, unspecified: Secondary | ICD-10-CM

## 2018-06-07 HISTORY — DX: Chest pain, unspecified: R07.9

## 2018-06-27 ENCOUNTER — Emergency Department (HOSPITAL_COMMUNITY): Payer: Medicare Other

## 2018-06-27 ENCOUNTER — Encounter (HOSPITAL_COMMUNITY): Payer: Self-pay

## 2018-06-27 ENCOUNTER — Other Ambulatory Visit: Payer: Self-pay

## 2018-06-27 ENCOUNTER — Emergency Department (HOSPITAL_COMMUNITY)
Admission: EM | Admit: 2018-06-27 | Discharge: 2018-06-27 | Disposition: A | Discharge status: Home / Self Care | Payer: Medicare Other | Attending: Emergency Medicine | Admitting: Emergency Medicine

## 2018-06-27 DIAGNOSIS — R1030 Lower abdominal pain, unspecified: Secondary | ICD-10-CM | POA: Diagnosis not present

## 2018-06-27 DIAGNOSIS — K529 Noninfective gastroenteritis and colitis, unspecified: Secondary | ICD-10-CM | POA: Diagnosis not present

## 2018-06-27 DIAGNOSIS — I2510 Atherosclerotic heart disease of native coronary artery without angina pectoris: Secondary | ICD-10-CM | POA: Diagnosis not present

## 2018-06-27 DIAGNOSIS — R072 Precordial pain: Secondary | ICD-10-CM | POA: Diagnosis not present

## 2018-06-27 DIAGNOSIS — I10 Essential (primary) hypertension: Secondary | ICD-10-CM

## 2018-06-27 DIAGNOSIS — Z7982 Long term (current) use of aspirin: Secondary | ICD-10-CM | POA: Insufficient documentation

## 2018-06-27 DIAGNOSIS — Z87891 Personal history of nicotine dependence: Secondary | ICD-10-CM | POA: Insufficient documentation

## 2018-06-27 DIAGNOSIS — R079 Chest pain, unspecified: Secondary | ICD-10-CM | POA: Diagnosis present

## 2018-06-27 LAB — BASIC METABOLIC PANEL
Anion gap: 8 (ref 5–15)
BUN: 10 mg/dL (ref 8–23)
CO2: 26 mmol/L (ref 22–32)
CREATININE: 1.07 mg/dL (ref 0.61–1.24)
Calcium: 8.8 mg/dL — ABNORMAL LOW (ref 8.9–10.3)
Chloride: 102 mmol/L (ref 98–111)
GFR calc Af Amer: >60 mL/min (ref >60)
Glucose, Bld: 90 mg/dL (ref 70–99)
Potassium: 3.8 mmol/L (ref 3.5–5.1)
Sodium: 136 mmol/L (ref 135–145)

## 2018-06-27 LAB — HEPATIC FUNCTION PANEL
ALBUMIN: 3.9 g/dL (ref 3.5–5.0)
ALK PHOS: 71 U/L (ref 38–126)
ALT: 29 U/L (ref 0–44)
AST: 33 U/L (ref 15–41)
BILIRUBIN TOTAL: 1 mg/dL (ref 0.3–1.2)
Bilirubin, Direct: 0.1 mg/dL (ref 0.0–0.2)
Indirect Bilirubin: 0.9 mg/dL (ref 0.3–0.9)
Total Protein: 7.8 g/dL (ref 6.5–8.1)

## 2018-06-27 LAB — CBC
HEMATOCRIT: 44.5 % (ref 39.0–52.0)
HEMOGLOBIN: 14.4 g/dL (ref 13.0–17.0)
MCH: 29 pg (ref 26.0–34.0)
MCHC: 32.4 g/dL (ref 30.0–36.0)
MCV: 89.5 fL (ref 80.0–100.0)
Platelets: 200 10*3/uL (ref 150–400)
RBC: 4.97 MIL/uL (ref 4.22–5.81)
RDW: 14.6 % (ref 11.5–15.5)
WBC: 3.7 10*3/uL — AB (ref 4.0–10.5)
nRBC: 0 % (ref 0.0–0.2)

## 2018-06-27 LAB — I-STAT TROPONIN, ED
Troponin i, poc: 0.02 ng/mL (ref 0.00–0.08)
Troponin i, poc: 0.01 ng/mL (ref 0.00–0.08)

## 2018-06-27 LAB — URINALYSIS, ROUTINE W REFLEX MICROSCOPIC
BILIRUBIN URINE: NEGATIVE
Glucose, UA: NEGATIVE mg/dL
Hgb urine dipstick: NEGATIVE
KETONES UR: NEGATIVE mg/dL
Leukocytes,Ua: NEGATIVE
Nitrite: NEGATIVE
PROTEIN: NEGATIVE mg/dL
Specific Gravity, Urine: 1.012 (ref 1.005–1.030)
pH: 7 (ref 5.0–8.0)

## 2018-06-27 LAB — LIPASE, BLOOD: Lipase: 38 U/L (ref 11–51)

## 2018-06-27 MED ORDER — SODIUM CHLORIDE 0.9 % IV SOLN
INTRAVENOUS | Status: DC
  Administered 2018-06-27: 17:00:00 via INTRAVENOUS

## 2018-06-27 MED ORDER — METOPROLOL TARTRATE 25 MG PO TABS: 50.0000 mg | ORAL_TABLET | Freq: Once | ORAL | Status: DC

## 2018-06-27 MED ORDER — CIPROFLOXACIN HCL 500 MG PO TABS
500.0000 mg | ORAL_TABLET | Freq: Once | ORAL | Status: AC
  Administered 2018-06-27: 500 mg via ORAL
  Filled 2018-06-27: qty 1

## 2018-06-27 MED ORDER — METRONIDAZOLE 500 MG PO TABS: 500.0000 mg | ORAL_TABLET | Freq: Two times a day (BID) | ORAL | 0 refills | Status: DC

## 2018-06-27 MED ORDER — LISINOPRIL 10 MG PO TABS: 10.0000 mg | ORAL_TABLET | Freq: Every day | ORAL | 0 refills | Status: AC

## 2018-06-27 MED ORDER — SODIUM CHLORIDE 0.9 % IV BOLUS
500.0000 mL | Freq: Once | INTRAVENOUS | Status: AC
  Administered 2018-06-27: 500 mL via INTRAVENOUS

## 2018-06-27 MED ORDER — SODIUM CHLORIDE 0.9% FLUSH
3.0000 mL | Freq: Once | INTRAVENOUS | Status: AC
  Administered 2018-06-27: 3 mL via INTRAVENOUS

## 2018-06-27 MED ORDER — METRONIDAZOLE 500 MG PO TABS
500.0000 mg | ORAL_TABLET | Freq: Once | ORAL | Status: AC
  Administered 2018-06-27: 500 mg via ORAL
  Filled 2018-06-27: qty 1

## 2018-06-27 MED ORDER — HYDROCODONE-ACETAMINOPHEN 5-325 MG PO TABS
2.0000 | ORAL_TABLET | Freq: Once | ORAL | Status: AC
  Administered 2018-06-27: 2 via ORAL
  Filled 2018-06-27: qty 2

## 2018-06-27 MED ORDER — ONDANSETRON HCL 4 MG/2ML IJ SOLN
4.0000 mg | Freq: Once | INTRAMUSCULAR | Status: AC
  Administered 2018-06-27: 4 mg via INTRAVENOUS
  Filled 2018-06-27: qty 2

## 2018-06-27 MED ORDER — HYDROMORPHONE HCL 1 MG/ML IJ SOLN
0.5000 mg | Freq: Once | INTRAMUSCULAR | Status: AC
  Administered 2018-06-27: 0.5 mg via INTRAVENOUS
  Filled 2018-06-27: qty 1

## 2018-06-27 MED ORDER — HYDROCHLOROTHIAZIDE 12.5 MG PO CAPS
12.5000 mg | ORAL_CAPSULE | Freq: Once | ORAL | Status: AC
  Administered 2018-06-27: 12.5 mg via ORAL
  Filled 2018-06-27: qty 1

## 2018-06-27 MED ORDER — HYDROCODONE-ACETAMINOPHEN 5-325 MG PO TABS: 1.0000 | ORAL_TABLET | Freq: Four times a day (QID) | ORAL | 0 refills | Status: DC | PRN

## 2018-06-27 MED ORDER — SODIUM CHLORIDE (PF) 0.9 % IJ SOLN
INTRAMUSCULAR | Status: AC
  Filled 2018-06-27: qty 50

## 2018-06-27 MED ORDER — CIPROFLOXACIN HCL 500 MG PO TABS: 500.0000 mg | ORAL_TABLET | Freq: Two times a day (BID) | ORAL | 0 refills | Status: DC

## 2018-06-27 MED ORDER — IOHEXOL 300 MG/ML  SOLN
100.0000 mL | Freq: Once | INTRAMUSCULAR | Status: AC | PRN
  Administered 2018-06-27: 100 mL via INTRAVENOUS

## 2018-06-27 NOTE — ED Notes (Signed)
Patient transported to X-ray 

## 2018-06-27 NOTE — ED Triage Notes (Signed)
Pt states that he has been having RLQ abd pain x 1 week, as well as chest pain x1 week. Pt states it started "out of nowhere". Hx of "stents in his heart".

## 2018-06-27 NOTE — Discharge Instructions (Signed)
Take cipro and flagyl as prescribed.   Take motrin for pain.   Take vicodin for severe pain.   Take lisinopril 10 mg daily for hypertension.   See your doctor and cardiologist for follow up   Return to ER if you have severe abdominal pain, vomiting, fever.

## 2018-06-27 NOTE — ED Provider Notes (Signed)
  Physical Exam  BP (!) 185/114   Pulse (!) 59   Temp 98.7 F (37.1 C) (Oral)   Resp 17   Ht 6\' 2"  (1.88 m)   Wt 113.4 kg   SpO2 98%   BMI 32.10 kg/m   Physical Exam  ED Course/Procedures     Procedures  MDM  Patient care assumed at 4 PM. Patient here with RLQ pain, hx of diverticulitis. Also had hx of CAD with CABG and stent and had atypical chest pain for several days. Sign out pending CT ab/pel and delta trop.   6:09 PM Delta trop negative. CT showed colitis. Given cipro/flagyl. Also is hypertensive and is not on BP meds. Will dc home with lisinopril for hypertension. Will give short course of vicodin for pain. Prescriptions were sent to his pharmacy electronically. Gave strict return precautions.        Drenda Freeze, MD 06/27/18 [phone]

## 2018-06-27 NOTE — ED Notes (Signed)
Patient transported to CT 

## 2018-06-27 NOTE — ED Provider Notes (Signed)
[facility] DEPT Provider Note   CSN: [id number] Arrival date & time: 06/27/18  1304    History   Chief Complaint Chief Complaint  Patient presents with  . Chest Pain  . Abdominal Pain    HPI Nicholas Carroll is a 68 y.o. male.     Patient brought in by family member.  Patient has been having lower quadrant abdominal pain for about 2 weeks.  Nursing triage set a week.  Also at the same time is been having some lower anterior chest bilateral pain that is intermittent all episodes less than 5 minutes.  But have been increasing more today.  No nausea vomiting no diarrhea.  Patient did have a colonoscopy done Thursday a week ago without any significant findings he did have both of these pains at times at that time.  Patient records show that he had a cardiac catheterization and 2016 by Dr. Doylene Canard.  Without any significant disease.  No stents.  Medical treatment was the recommendation at that time.  But does have risk factors with hyperlipidemia and hypertension and blood pressure is elevated here today.  Patient does not have any blood pressures medicines listed on his medication list.  Without any fevers or upper respiratory symptoms.  No coronavirus exposure and no travel to any high risk areas.     Past Medical History:  Diagnosis Date  . Coronary artery disease   . Hyperlipidemia   . Hypertension   . Obesity   . Sexual dysfunction     Patient Active Problem List   Diagnosis Date Noted  . Essential hypertension 12/23/2014  . Atypical chest pain 12/23/2014  . Coronary artery disease involving native coronary artery 12/23/2014  . Chest pain 12/23/2014  . Leg pain     Past Surgical History:  Procedure Laterality Date  . CARDIAC CATHETERIZATION N/A 12/23/2014   Procedure: Left Heart Cath and Coronary Angiography;  Surgeon: Dixie Dials, MD;  Location: [facility];  Service: Cardiovascular;  Laterality: N/A;        Home Medications     Prior to Admission medications   Medication Sig Start Date End Date Taking? Authorizing Provider  aspirin 81 MG tablet Take 81 mg by mouth daily.   Yes [provider]  atorvastatin (LIPITOR) 20 MG tablet Take 1 tablet (20 mg total) by mouth daily. Patient not taking: Reported on 06/27/2018 12/24/14   Orson Eva, MD  HYDROcodone-acetaminophen (NORCO/VICODIN) 5-325 MG tablet Take 1 tablet by mouth every 6 (six) hours as needed for severe pain. Patient not taking: Reported on 06/27/2018 12/23/15   Duffy Bruce, MD  naproxen (NAPROSYN) 375 MG tablet Take 1 tablet (375 mg total) by mouth 2 (two) times daily as needed for moderate pain. Patient not taking: Reported on 06/27/2018 12/23/15   Duffy Bruce, MD    Family History Family History  Problem Relation Age of Onset  . Other Other        Pacemaker    Social History Social History   Tobacco Use  . Smoking status: Former Research scientist (life sciences)  . Smokeless tobacco: Never Used  Substance Use Topics  . Alcohol use: No  . Drug use: No     Allergies   Patient has no known allergies.   Review of Systems Review of Systems  Constitutional: Negative for chills and fever.  HENT: Negative for congestion, rhinorrhea and sore throat.   Eyes: Negative for visual disturbance.  Respiratory: Negative for cough and shortness of breath.  Cardiovascular: Positive for chest pain. Negative for leg swelling.  Gastrointestinal: Positive for abdominal pain. Negative for diarrhea, nausea and vomiting.  Genitourinary: Negative for dysuria.  Musculoskeletal: Negative for back pain and neck pain.  Skin: Negative for rash.  Neurological: Negative for dizziness, light-headedness and headaches.  Hematological: Does not bruise/bleed easily.  Psychiatric/Behavioral: Negative for confusion.     Physical Exam Updated Vital Signs BP (!) 162/99   Pulse (!) 57   Temp 98.7 F (37.1 C) (Oral)   Resp 19   Ht 1.88 m (6\' 2" )   Wt 113.4 kg   SpO2 99%   BMI  32.10 kg/m   Physical Exam Vitals signs and nursing note reviewed.  Constitutional:      Appearance: He is well-developed.  HENT:     Head: Normocephalic and atraumatic.     Nose: No congestion.     Mouth/Throat:     Mouth: Mucous membranes are moist.  Eyes:     Extraocular Movements: Extraocular movements intact.     Conjunctiva/sclera: Conjunctivae normal.     Pupils: Pupils are equal, round, and reactive to light.  Neck:     Musculoskeletal: Neck supple.  Cardiovascular:     Rate and Rhythm: Normal rate and regular rhythm.     Heart sounds: Normal heart sounds. No murmur.  Pulmonary:     Effort: Pulmonary effort is normal. No respiratory distress.     Breath sounds: Normal breath sounds.  Abdominal:     General: Abdomen is flat. Bowel sounds are normal.     Palpations: Abdomen is soft.     Tenderness: There is abdominal tenderness.     Comments: Mild tenderness to palpation lower quadrants no guarding.  Musculoskeletal: Normal range of motion.        General: No swelling.  Skin:    General: Skin is warm and dry.     Capillary Refill: Capillary refill takes less than 2 seconds.  Neurological:     General: No focal deficit present.     Mental Status: He is alert and oriented to person, place, and time.     Cranial Nerves: No cranial nerve deficit.     Sensory: No sensory deficit.     Coordination: Coordination normal.      ED Treatments / Results  Labs (all labs ordered are listed, but only abnormal results are displayed) Labs Reviewed  BASIC METABOLIC PANEL - Abnormal; Notable for the following components:      Result Value   Calcium 8.8 (*)    All other components within normal limits  CBC - Abnormal; Notable for the following components:   WBC 3.7 (*)    All other components within normal limits  LIPASE, BLOOD  HEPATIC FUNCTION PANEL  URINALYSIS, ROUTINE W REFLEX MICROSCOPIC  I-STAT TROPONIN, ED  I-STAT TROPONIN, ED    EKG EKG Interpretation   Date/Time:  Saturday June 27 2018 13:15:52 EDT Ventricular Rate:  63 PR Interval:    QRS Duration: 87 QT Interval:  429 QTC Calculation: 440 R Axis:   42 Text Interpretation:  Sinus rhythm Ventricular premature complex Abnormal R-wave progression, early transition No significant change since last tracing Confirmed by Fredia Sorrow [phone]) on 06/27/2018 1:54:25 PM   Radiology Dg Chest 2 View  Result Date: 06/27/2018 CLINICAL DATA:  Right lower quadrant and chest pain for 1 week. EXAM: CHEST - 2 VIEW COMPARISON:  PA and lateral chest 12/22/2014. FINDINGS: Lungs clear. Heart size normal. Aortic atherosclerosis noted. No pneumothorax  or pleural fluid. No acute or focal bony abnormality. IMPRESSION: No acute disease. Atherosclerosis. Electronically Signed   By: Inge Rise M.D.   On: 06/27/2018 13:44    Procedures Procedures (including critical care time)  Medications Ordered in ED Medications  0.9 %  sodium chloride infusion ( Intravenous New Bag/Given 06/27/18 1641)  sodium chloride (PF) 0.9 % injection (has no administration in time range)  sodium chloride flush (NS) 0.9 % injection 3 mL (3 mLs Intravenous Given 06/27/18 1404)  sodium chloride 0.9 % bolus 500 mL (0 mLs Intravenous Stopped 06/27/18 1634)  ondansetron (ZOFRAN) injection 4 mg (4 mg Intravenous Given 06/27/18 1419)  HYDROmorphone (DILAUDID) injection 0.5 mg (0.5 mg Intravenous Given 06/27/18 1419)  hydrochlorothiazide (MICROZIDE) capsule 12.5 mg (12.5 mg Oral Given 06/27/18 1634)     Initial Impression / Assessment and Plan / ED Course  I have reviewed the triage vital signs and the nursing notes.  Pertinent labs & imaging results that were available during my care of the patient were reviewed by me and considered in my medical decision making (see chart for details).        Patient status post cardiac catheterization in 2016 with no significant coronary artery disease at that time.  Certainly had no stents  despite what was mentioned may be in triage.  Patient's been having intermittent lower anterior bilateral chest pain now for 2 weeks.  Also intermittent abdominal pain.  The chest pain lasts less than 5 minutes which episode but has been occurring more frequently here in the last 24 hours.  The belly pain is more severe lower quadrants patient states that is 8 out of 10 in pain scale.  No nausea vomiting or diarrhea.  No history of any similar abdominal pain.  Patient 9 days ago did have a colonoscopy did have all the symptoms ongoing then.  The colonoscopy was not done for this reason that was done just as a generalized screen and they had no significant findings.  Nothing has gotten worse since then.  Some Tums or similar before the colonoscopy as to after the colonoscopy.  Patient's initial work-up chest x-ray initial troponin negative.  CT abdomen and pelvis is pending.  And patient will have a repeat troponin.  If both troponins are negative patient can be discharged home and follow-up with cardiology again.  If CT scan is negative can be treated symptomatically.  Patient does have cardiac risk factors but the pain has been ongoing now for a number of days and not significantly worse.  Feel that 2- troponins rules out an unstable angina picture.  Patient to be discharged home with precautions that if the chest pain starts lasting 15 minutes or longer to return.  Final Clinical Impressions(s) / ED Diagnoses   Final diagnoses:  Precordial pain  Lower abdominal pain    ED Discharge Orders    None       Fredia Sorrow, MD 06/27/18 1657

## 2018-07-01 ENCOUNTER — Observation Stay (HOSPITAL_COMMUNITY)
Admission: AD | Admit: 2018-07-01 | Discharge: 2018-07-03 | Disposition: A | Discharge status: Home / Self Care | Payer: Medicare Other | Source: Ambulatory Visit | Attending: Cardiovascular Disease | Admitting: Cardiovascular Disease

## 2018-07-01 ENCOUNTER — Encounter (HOSPITAL_COMMUNITY): Payer: Self-pay | Admitting: General Practice

## 2018-07-01 ENCOUNTER — Other Ambulatory Visit: Payer: Self-pay

## 2018-07-01 DIAGNOSIS — Z79899 Other long term (current) drug therapy: Secondary | ICD-10-CM | POA: Insufficient documentation

## 2018-07-01 DIAGNOSIS — Z955 Presence of coronary angioplasty implant and graft: Secondary | ICD-10-CM | POA: Insufficient documentation

## 2018-07-01 DIAGNOSIS — E669 Obesity, unspecified: Secondary | ICD-10-CM | POA: Diagnosis not present

## 2018-07-01 DIAGNOSIS — K760 Fatty (change of) liver, not elsewhere classified: Secondary | ICD-10-CM | POA: Diagnosis not present

## 2018-07-01 DIAGNOSIS — I10 Essential (primary) hypertension: Secondary | ICD-10-CM | POA: Diagnosis present

## 2018-07-01 DIAGNOSIS — Z6832 Body mass index (BMI) 32.0-32.9, adult: Secondary | ICD-10-CM | POA: Diagnosis not present

## 2018-07-01 DIAGNOSIS — I249 Acute ischemic heart disease, unspecified: Principal | ICD-10-CM | POA: Diagnosis present

## 2018-07-01 DIAGNOSIS — Z87891 Personal history of nicotine dependence: Secondary | ICD-10-CM | POA: Insufficient documentation

## 2018-07-01 DIAGNOSIS — R079 Chest pain, unspecified: Secondary | ICD-10-CM

## 2018-07-01 DIAGNOSIS — K529 Noninfective gastroenteritis and colitis, unspecified: Secondary | ICD-10-CM | POA: Insufficient documentation

## 2018-07-01 DIAGNOSIS — Z7982 Long term (current) use of aspirin: Secondary | ICD-10-CM | POA: Diagnosis not present

## 2018-07-01 DIAGNOSIS — I700 Atherosclerosis of aorta: Secondary | ICD-10-CM | POA: Insufficient documentation

## 2018-07-01 DIAGNOSIS — I25110 Atherosclerotic heart disease of native coronary artery with unstable angina pectoris: Secondary | ICD-10-CM | POA: Insufficient documentation

## 2018-07-01 DIAGNOSIS — I2510 Atherosclerotic heart disease of native coronary artery without angina pectoris: Secondary | ICD-10-CM | POA: Diagnosis present

## 2018-07-01 DIAGNOSIS — E785 Hyperlipidemia, unspecified: Secondary | ICD-10-CM | POA: Diagnosis not present

## 2018-07-01 DIAGNOSIS — D72819 Decreased white blood cell count, unspecified: Secondary | ICD-10-CM | POA: Diagnosis not present

## 2018-07-01 HISTORY — DX: Chest pain, unspecified: R07.9

## 2018-07-01 LAB — CBC WITH DIFFERENTIAL/PLATELET
Abs Immature Granulocytes: 0.01 10*3/uL (ref 0.00–0.07)
Basophils Absolute: 0 10*3/uL (ref 0.0–0.1)
Basophils Relative: 0 %
Eosinophils Absolute: 0 10*3/uL (ref 0.0–0.5)
Eosinophils Relative: 1 %
HCT: 42.8 % (ref 39.0–52.0)
HEMOGLOBIN: 14.3 g/dL (ref 13.0–17.0)
Immature Granulocytes: 0 %
LYMPHS ABS: 1.3 10*3/uL (ref 0.7–4.0)
Lymphocytes Relative: 40 %
MCH: 28.3 pg (ref 26.0–34.0)
MCHC: 33.4 g/dL (ref 30.0–36.0)
MCV: 84.6 fL (ref 80.0–100.0)
MONOS PCT: 10 %
Monocytes Absolute: 0.3 10*3/uL (ref 0.1–1.0)
Neutro Abs: 1.6 10*3/uL — ABNORMAL LOW (ref 1.7–7.7)
Neutrophils Relative %: 49 %
Platelets: 166 10*3/uL (ref 150–400)
RBC: 5.06 MIL/uL (ref 4.22–5.81)
RDW: 13.8 % (ref 11.5–15.5)
WBC: 3.2 10*3/uL — ABNORMAL LOW (ref 4.0–10.5)
nRBC: 0 % (ref 0.0–0.2)

## 2018-07-01 LAB — COMPREHENSIVE METABOLIC PANEL
ALBUMIN: 3.7 g/dL (ref 3.5–5.0)
ALT: 44 U/L (ref 0–44)
AST: 57 U/L — ABNORMAL HIGH (ref 15–41)
Alkaline Phosphatase: 69 U/L (ref 38–126)
Anion gap: 9 (ref 5–15)
BUN: 8 mg/dL (ref 8–23)
CO2: 25 mmol/L (ref 22–32)
CREATININE: 1.06 mg/dL (ref 0.61–1.24)
Calcium: 9 mg/dL (ref 8.9–10.3)
Chloride: 100 mmol/L (ref 98–111)
GFR calc Af Amer: >60 mL/min (ref >60)
GFR calc non Af Amer: >60 mL/min (ref >60)
GLUCOSE: 83 mg/dL (ref 70–99)
Potassium: 3.5 mmol/L (ref 3.5–5.1)
Sodium: 134 mmol/L — ABNORMAL LOW (ref 135–145)
Total Bilirubin: 1.4 mg/dL — ABNORMAL HIGH (ref 0.3–1.2)
Total Protein: 7.6 g/dL (ref 6.5–8.1)

## 2018-07-01 LAB — PROTIME-INR
INR: 1 (ref 0.8–1.2)
Prothrombin Time: 12.9 seconds (ref 11.4–15.2)

## 2018-07-01 LAB — BRAIN NATRIURETIC PEPTIDE: B Natriuretic Peptide: 40.1 pg/mL (ref 0.0–100.0)

## 2018-07-01 LAB — APTT: aPTT: 29 seconds (ref 24–36)

## 2018-07-01 LAB — TROPONIN I: Troponin I: <0.03 ng/mL (ref <0.03)

## 2018-07-01 MED ORDER — ASPIRIN EC 81 MG PO TBEC
81.0000 mg | DELAYED_RELEASE_TABLET | Freq: Every day | ORAL | Status: DC
  Administered 2018-07-02 – 2018-07-03 (×2): 81 mg via ORAL
  Filled 2018-07-01 (×2): qty 1

## 2018-07-01 MED ORDER — SODIUM CHLORIDE 0.9% FLUSH: 3.0000 mL | INTRAVENOUS | Status: DC | PRN

## 2018-07-01 MED ORDER — HEPARIN (PORCINE) 25000 UT/250ML-% IV SOLN
1400.0000 [IU]/h | INTRAVENOUS | Status: DC
  Administered 2018-07-01: 1400 [IU]/h via INTRAVENOUS
  Filled 2018-07-01: qty 250

## 2018-07-01 MED ORDER — HEPARIN BOLUS VIA INFUSION
4000.0000 [IU] | Freq: Once | INTRAVENOUS | Status: AC
  Administered 2018-07-01: 4000 [IU] via INTRAVENOUS
  Filled 2018-07-01: qty 4000

## 2018-07-01 MED ORDER — ONDANSETRON HCL 4 MG/2ML IJ SOLN: 4.0000 mg | Freq: Four times a day (QID) | INTRAMUSCULAR | Status: DC | PRN

## 2018-07-01 MED ORDER — SODIUM CHLORIDE 0.9 % IV SOLN: 250.0000 mL | INTRAVENOUS | Status: DC | PRN

## 2018-07-01 MED ORDER — CARVEDILOL 3.125 MG PO TABS
3.1250 mg | ORAL_TABLET | Freq: Two times a day (BID) | ORAL | Status: DC
  Administered 2018-07-01 – 2018-07-03 (×3): 3.125 mg via ORAL
  Filled 2018-07-01 (×3): qty 1

## 2018-07-01 MED ORDER — CIPROFLOXACIN HCL 500 MG PO TABS
500.0000 mg | ORAL_TABLET | Freq: Two times a day (BID) | ORAL | Status: DC
  Administered 2018-07-01 – 2018-07-03 (×4): 500 mg via ORAL
  Filled 2018-07-01 (×4): qty 1

## 2018-07-01 MED ORDER — METRONIDAZOLE 500 MG PO TABS
500.0000 mg | ORAL_TABLET | Freq: Two times a day (BID) | ORAL | Status: DC
  Administered 2018-07-01 – 2018-07-03 (×4): 500 mg via ORAL
  Filled 2018-07-01 (×4): qty 1

## 2018-07-01 MED ORDER — ACETAMINOPHEN 325 MG PO TABS
650.0000 mg | ORAL_TABLET | ORAL | Status: DC | PRN
  Administered 2018-07-03: 650 mg via ORAL
  Filled 2018-07-01: qty 2

## 2018-07-01 MED ORDER — SODIUM CHLORIDE 0.9% FLUSH
3.0000 mL | Freq: Two times a day (BID) | INTRAVENOUS | Status: DC
  Administered 2018-07-02 – 2018-07-03 (×3): 3 mL via INTRAVENOUS

## 2018-07-01 MED ORDER — NITROGLYCERIN 0.4 MG SL SUBL: 0.4000 mg | SUBLINGUAL_TABLET | SUBLINGUAL | Status: DC | PRN

## 2018-07-01 MED ORDER — ATORVASTATIN CALCIUM 40 MG PO TABS
40.0000 mg | ORAL_TABLET | Freq: Every day | ORAL | Status: DC
  Administered 2018-07-01 – 2018-07-02 (×2): 40 mg via ORAL
  Filled 2018-07-01 (×2): qty 1

## 2018-07-01 MED ORDER — AMLODIPINE BESYLATE 2.5 MG PO TABS
5.0000 mg | ORAL_TABLET | Freq: Every day | ORAL | Status: DC
  Administered 2018-07-01 – 2018-07-03 (×3): 5 mg via ORAL
  Filled 2018-07-01 (×3): qty 2

## 2018-07-01 MED ORDER — ASPIRIN 300 MG RE SUPP
300.0000 mg | RECTAL | Status: DC
  Filled 2018-07-01: qty 1

## 2018-07-01 MED ORDER — ASPIRIN 81 MG PO CHEW
324.0000 mg | CHEWABLE_TABLET | ORAL | Status: DC
  Administered 2018-07-01: 324 mg via ORAL
  Filled 2018-07-01: qty 4

## 2018-07-01 MED ORDER — LISINOPRIL 10 MG PO TABS
10.0000 mg | ORAL_TABLET | Freq: Every day | ORAL | Status: DC
  Administered 2018-07-01 – 2018-07-03 (×3): 10 mg via ORAL
  Filled 2018-07-01 (×3): qty 1

## 2018-07-01 NOTE — Progress Notes (Signed)
Patient is alert with no distress SOB with exertion, No pain with Hep drip bolus 4000 an rate of 14 ml/hr

## 2018-07-01 NOTE — Progress Notes (Signed)
ANTICOAGULATION CONSULT NOTE - Initial Consult  Pharmacy Consult for heparin Indication: chest pain/ACS  No Known Allergies  Patient Measurements: Height: 6\' 3"  (190.5 cm) Weight: 253 lb 1.6 oz (114.8 kg) IBW/kg (Calculated) : 84.5 Heparin Dosing Weight: 108kg  Vital Signs: Temp: 99.3 F (37.4 C) (03/25 1700) Temp Source: Oral (03/25 1700) BP: 155/87 (03/25 1700) Pulse Rate: 65 (03/25 1700)  Labs: No results for input(s): HGB, HCT, PLT, APTT, LABPROT, INR, HEPARINUNFRC, HEPRLOWMOCWT, CREATININE, CKTOTAL, CKMB, TROPONINI in the last 72 hours.  Estimated Creatinine Clearance: 91.5 mL/min (by C-G formula based on SCr of 1.07 mg/dL).   Medical History: Past Medical History:  Diagnosis Date  . Coronary artery disease   . Hyperlipidemia   . Hypertension   . Obesity   . Sexual dysfunction    Assessment: 68 year old male presenting with chest pain and lower abdominal pain. New orders to start heparin for r/o MI and planning stress test in am.   Note he was started on a 7d course of cipro/flagyl on 06/27/18, stop dates entered from outpatient rx.   Goal of Therapy:  Heparin level 0.3-0.7 units/ml Monitor platelets by anticoagulation protocol: Yes   Plan:  Give 4000 units bolus x 1 Start heparin infusion at 1400 units/hr Check anti-Xa level in 6 hours and daily while on heparin Continue to monitor H&H and platelets  Erin Hearing PharmD., BCPS Clinical Pharmacist 07/01/2018 5:18 PM

## 2018-07-01 NOTE — H&P (Signed)
Referring Physician:  DAT DERKSEN is an 68 y.o. male.                       Chief Complaint: Chest pain and lower abdominal pain  HPI: 68 years old male with PMH of HTN, Hyperlipidemia, CAD and Obesity has recurrent chest pain as across the chest , non-radiating, lasting for 3-5 minutes, non-exertional and occasionally with diaphoresis for 2 weeks. He denies NTG use.  He also has lower abdominal pain, improving with antibiotics use for 5 days now.  He denies fever or cough and nausea or diarrhea.  Past Medical History:  Diagnosis Date  . Coronary artery disease   . Hyperlipidemia   . Hypertension   . Obesity   . Sexual dysfunction       Past Surgical History:  Procedure Laterality Date  . CARDIAC CATHETERIZATION N/A 12/23/2014   Procedure: Left Heart Cath and Coronary Angiography;  Surgeon: Dixie Dials, MD;  Location: [facility];  Service: Cardiovascular;  Laterality: N/A;    Family History  Problem Relation Age of Onset  . Other Other        Pacemaker   Social History:  reports that he has quit smoking. He has never used smokeless tobacco. He reports that he does not drink alcohol or use drugs.  Allergies: No Known Allergies  Medications Prior to Admission  Medication Sig Dispense Refill  . aspirin 81 MG tablet Take 81 mg by mouth daily.    Marland Kitchen atorvastatin (LIPITOR) 20 MG tablet Take 1 tablet (20 mg total) by mouth daily. (Patient not taking: Reported on 06/27/2018) 30 tablet 0  . ciprofloxacin (CIPRO) 500 MG tablet Take 1 tablet (500 mg total) by mouth 2 (two) times daily. One po bid x 7 days 14 tablet 0  . HYDROcodone-acetaminophen (NORCO/VICODIN) 5-325 MG tablet Take 1 tablet by mouth every 6 (six) hours as needed for severe pain. 10 tablet 0  . lisinopril (PRINIVIL,ZESTRIL) 10 MG tablet Take 1 tablet (10 mg total) by mouth daily. 30 tablet 0  . metroNIDAZOLE (FLAGYL) 500 MG tablet Take 1 tablet (500 mg total) by mouth 2 (two) times daily. One po bid x 7 days  14 tablet 0  . naproxen (NAPROSYN) 375 MG tablet Take 1 tablet (375 mg total) by mouth 2 (two) times daily as needed for moderate pain. (Patient not taking: Reported on 06/27/2018) 20 tablet 0    No results found for this or any previous visit (from the past 48 hour(s)). No results found.  Review Of Systems Constitutional: No fever, chills, weight loss or gain. Eyes: No vision change, wears glasses. No discharge or pain. Ears: Mild bilateral hearing loss, No tinnitus. Respiratory: No asthma, COPD, pneumonias. No shortness of breath. No hemoptysis. Cardiovascular: Positive chest pain, palpitation, leg edema. Gastrointestinal: No nausea, vomiting, diarrhea, constipation. No GI bleed. No hepatitis. Genitourinary: No dysuria, hematuria, kidney stone. No incontinance. Neurological: No headache, stroke, seizures.  Psychiatry: No psych facility admission for anxiety, depression, suicide. No detox. Skin: No rash. Musculoskeletal: Positive joint pain, no fibromyalgia. No neck pain, back pain. Lymphadenopathy: No lymphadenopathy. Hematology: No anemia or easy bruising.   There were no vitals taken for this visit. There is no height or weight on file to calculate BMI. General appearance: alert, cooperative, appears stated age and mild distress Head: Normocephalic, atraumatic. Eyes: Brown eyes, pink conjunctiva, corneas clear. PERRL, EOM's intact. Neck: No adenopathy, no carotid bruit, no JVD, supple, symmetrical, trachea midline  and thyroid not enlarged. Resp: Clear to auscultation bilaterally. Cardio: Regular rate and rhythm, S1, S2 normal, II/VI systolic murmur, no click, rub or gallop GI: Soft, non-tender; bowel sounds normal; no organomegaly. Extremities: trace edema, no cyanosis or clubbing. Skin: Warm and dry.  Neurologic: Alert and oriented X 3, normal strength. Normal coordination and gait.  Assessment/Plan Acute coronary syndrome HTN Hyperlipidemia Obesity CAD S/P stent  Place  in observation R/O MI NM myocardial stress test in AM. Home medications.  Birdie Riddle, MD  07/01/2018, 4:57 PM

## 2018-07-02 ENCOUNTER — Observation Stay (HOSPITAL_COMMUNITY): Payer: Medicare Other

## 2018-07-02 DIAGNOSIS — E669 Obesity, unspecified: Secondary | ICD-10-CM | POA: Diagnosis not present

## 2018-07-02 DIAGNOSIS — I10 Essential (primary) hypertension: Secondary | ICD-10-CM | POA: Diagnosis not present

## 2018-07-02 DIAGNOSIS — E785 Hyperlipidemia, unspecified: Secondary | ICD-10-CM | POA: Diagnosis not present

## 2018-07-02 DIAGNOSIS — I249 Acute ischemic heart disease, unspecified: Secondary | ICD-10-CM | POA: Diagnosis not present

## 2018-07-02 LAB — BASIC METABOLIC PANEL
Anion gap: 5 (ref 5–15)
BUN: 9 mg/dL (ref 8–23)
CO2: 25 mmol/L (ref 22–32)
Calcium: 8.7 mg/dL — ABNORMAL LOW (ref 8.9–10.3)
Chloride: 104 mmol/L (ref 98–111)
Creatinine, Ser: 1.21 mg/dL (ref 0.61–1.24)
GFR calc Af Amer: >60 mL/min (ref >60)
GFR calc non Af Amer: >60 mL/min (ref >60)
Glucose, Bld: 99 mg/dL (ref 70–99)
Potassium: 3.7 mmol/L (ref 3.5–5.1)
Sodium: 134 mmol/L — ABNORMAL LOW (ref 135–145)

## 2018-07-02 LAB — LIPID PANEL
Cholesterol: 169 mg/dL (ref 0–200)
HDL: 35 mg/dL — ABNORMAL LOW (ref >40)
LDL Cholesterol: 120 mg/dL — ABNORMAL HIGH (ref 0–99)
Total CHOL/HDL Ratio: 4.8 RATIO
Triglycerides: 72 mg/dL (ref <150)
VLDL: 14 mg/dL (ref 0–40)

## 2018-07-02 LAB — CBC
HCT: 38.7 % — ABNORMAL LOW (ref 39.0–52.0)
Hemoglobin: 13.2 g/dL (ref 13.0–17.0)
MCH: 28.9 pg (ref 26.0–34.0)
MCHC: 34.1 g/dL (ref 30.0–36.0)
MCV: 84.7 fL (ref 80.0–100.0)
PLATELETS: 152 10*3/uL (ref 150–400)
RBC: 4.57 MIL/uL (ref 4.22–5.81)
RDW: 13.9 % (ref 11.5–15.5)
WBC: 3.2 10*3/uL — ABNORMAL LOW (ref 4.0–10.5)
nRBC: 0 % (ref 0.0–0.2)

## 2018-07-02 LAB — HEPARIN LEVEL (UNFRACTIONATED)
Heparin Unfractionated: 0.47 IU/mL (ref 0.30–0.70)
Heparin Unfractionated: 0.58 IU/mL (ref 0.30–0.70)
Heparin Unfractionated: 0.18 IU/mL — ABNORMAL LOW (ref 0.30–0.70)

## 2018-07-02 LAB — TROPONIN I
Troponin I: <0.03 ng/mL (ref <0.03)
Troponin I: <0.03 ng/mL (ref <0.03)

## 2018-07-02 LAB — HIV ANTIBODY (ROUTINE TESTING W REFLEX): HIV Screen 4th Generation wRfx: NONREACTIVE

## 2018-07-02 MED ORDER — ISOSORBIDE MONONITRATE ER 30 MG PO TB24
15.0000 mg | ORAL_TABLET | Freq: Every day | ORAL | Status: DC
  Administered 2018-07-02 – 2018-07-03 (×2): 15 mg via ORAL
  Filled 2018-07-02 (×2): qty 1

## 2018-07-02 MED ORDER — TECHNETIUM TC 99M TETROFOSMIN IV KIT
30.0000 | PACK | Freq: Once | INTRAVENOUS | Status: AC | PRN
  Administered 2018-07-02: 30 via INTRAVENOUS

## 2018-07-02 MED ORDER — REGADENOSON 0.4 MG/5ML IV SOLN
INTRAVENOUS | Status: AC
  Filled 2018-07-02: qty 5

## 2018-07-02 MED ORDER — TECHNETIUM TC 99M TETROFOSMIN IV KIT
10.0000 | PACK | Freq: Once | INTRAVENOUS | Status: AC | PRN
  Administered 2018-07-02: 10 via INTRAVENOUS

## 2018-07-02 MED ORDER — REGADENOSON 0.4 MG/5ML IV SOLN
0.4000 mg | Freq: Once | INTRAVENOUS | Status: AC
  Administered 2018-07-02: 0.4 mg via INTRAVENOUS
  Filled 2018-07-02: qty 5

## 2018-07-02 NOTE — Progress Notes (Signed)
RefIona Beard, MD   Subjective:  Mild global hypokinesia and Mild GI attenuation and no inducible ischemia. CXR-unremarkable. Troponin I neg x 3.  Objective:  Vital Signs in the last 24 hours: Temp:  [98.6 F (37 C)-99.4 F (37.4 C)] 98.6 F (37 C) (03/26 1115) Pulse Rate:  [56-72] 56 (03/26 1115) Cardiac Rhythm: Sinus bradycardia (03/26 0703) Resp:  [18-20] 20 (03/26 1115) BP: (120-153)/(64-90) 153/88 (03/26 1115) SpO2:  [95 %-100 %] 100 % (03/26 1115) Weight:  [702 kg] 115 kg (03/26 0309)  Physical Exam: BP Readings from Last 1 Encounters:  07/02/18 (!) 153/88     Wt Readings from Last 1 Encounters:  07/02/18 115 kg    Weight change:  Body mass index is 31.7 kg/m. HEENT: [place]/AT, Eyes-Brown, PERL, EOMI, Conjunctiva-Pink, Sclera-Non-icteric Neck: No JVD, No bruit, Trachea midline. Lungs:  Clear, Bilateral. Cardiac:  Regular rhythm, normal S1 and S2, no S3. II/VI systolic murmur. Abdomen:  Soft, non-tender. BS present. Extremities:  No edema present. No cyanosis. No clubbing. CNS: AxOx3, Cranial nerves grossly intact, moves all 4 extremities.  Skin: Warm and dry.   Intake/Output from previous day: 03/25 0701 - 03/26 0700 In: 372.7 [P.O.:222; I.V.:150.7] Out: 825 [Urine:825]    Lab Results: BMET    Component Value Date/Time   NA 134 (L) 07/02/2018 0531   NA 134 (L) 07/01/2018 1706   NA 136 06/27/2018 1342   K 3.7 07/02/2018 0531   K 3.5 07/01/2018 1706   K 3.8 06/27/2018 1342   CL 104 07/02/2018 0531   CL 100 07/01/2018 1706   CL 102 06/27/2018 1342   CO2 25 07/02/2018 0531   CO2 25 07/01/2018 1706   CO2 26 06/27/2018 1342   GLUCOSE 99 07/02/2018 0531   GLUCOSE 83 07/01/2018 1706   GLUCOSE 90 06/27/2018 1342   BUN 9 07/02/2018 0531   BUN 8 07/01/2018 1706   BUN 10 06/27/2018 1342   CREATININE 1.21 07/02/2018 0531   CREATININE 1.06 07/01/2018 1706   CREATININE 1.07 06/27/2018 1342   CALCIUM 8.7 (L) 07/02/2018 0531   CALCIUM 9.0 07/01/2018 1706    CALCIUM 8.8 (L) 06/27/2018 1342   GFRNONAA >60 07/02/2018 0531   GFRNONAA >60 07/01/2018 1706   GFRNONAA >60 06/27/2018 1342   GFRAA >60 07/02/2018 0531   GFRAA >60 07/01/2018 1706   GFRAA >60 06/27/2018 1342   CBC    Component Value Date/Time   WBC 3.2 (L) 07/02/2018 0531   RBC 4.57 07/02/2018 0531   HGB 13.2 07/02/2018 0531   HCT 38.7 (L) 07/02/2018 0531   PLT 152 07/02/2018 0531   MCV 84.7 07/02/2018 0531   MCH 28.9 07/02/2018 0531   MCHC 34.1 07/02/2018 0531   RDW 13.9 07/02/2018 0531   LYMPHSABS 1.3 07/01/2018 1706   MONOABS 0.3 07/01/2018 1706   EOSABS 0.0 07/01/2018 1706   BASOSABS 0.0 07/01/2018 1706   HEPATIC Function Panel Recent Labs    06/27/18 1342 07/01/18 1706  PROT 7.8 7.6   HEMOGLOBIN A1C No components found for: HGA1C,  MPG CARDIAC ENZYMES Lab Results  Component Value Date   CKTOTAL 181 07/30/2009   CKMB 2.0 07/30/2009   TROPONINI <0.03 07/02/2018   TROPONINI <0.03 07/02/2018   TROPONINI <0.03 07/02/2018   BNP No results for input(s): PROBNP in the last 8760 hours. TSH No results for input(s): TSH in the last 8760 hours. CHOLESTEROL Recent Labs    07/02/18 0531  CHOL 169    Scheduled Meds: . amLODipine  5 mg Oral Daily  . aspirin EC  81 mg Oral Daily  . atorvastatin  40 mg Oral q1800  . carvedilol  3.125 mg Oral BID WC  . ciprofloxacin  500 mg Oral BID  . isosorbide mononitrate  15 mg Oral Daily  . lisinopril  10 mg Oral Daily  . metroNIDAZOLE  500 mg Oral BID  . regadenoson      . sodium chloride flush  3 mL Intravenous Q12H   Continuous Infusions: . sodium chloride     PRN Meds:.sodium chloride, acetaminophen, nitroGLYCERIN, ondansetron (ZOFRAN) IV, sodium chloride flush  Assessment/Plan: Angina, unstable CAD, non-obstructive Hypertension Hyperlipidemia Obesity S/P stent  Add isosorbide and amlodipine. Increase activity. Continue lisinopril and carvedilol.   LOS: 0 days    Dixie Dials  MD  07/02/2018, 5:20  PM

## 2018-07-02 NOTE — Progress Notes (Signed)
ANTICOAGULATION CONSULT NOTE - Follow Up Consult  Pharmacy Consult for heparin Indication: chest pain/ACS  Labs: Recent Labs    07/01/18 1706 07/02/18 0017  HGB 14.3  --   HCT 42.8  --   PLT 166  --   APTT 29  --   LABPROT 12.9  --   INR 1.0  --   HEPARINUNFRC  --  0.47  CREATININE 1.06  --   TROPONINI <0.03  --     Assessment/Plan:  68yo male therapeutic on heparin with initial dosing for CP. Will continue gtt at current rate and confirm stable with additional level.   Wynona Neat, PharmD, BCPS  07/02/2018,1:32 AM

## 2018-07-02 NOTE — Progress Notes (Signed)
ANTICOAGULATION CONSULT NOTE - Follow Up Consult  Pharmacy Consult for heparin Indication: chest pain/ACS  Labs: Recent Labs    07/01/18 1706 07/02/18 0017 07/02/18 0531 07/02/18 1205  HGB 14.3  --  13.2  --   HCT 42.8  --  38.7*  --   PLT 166  --  152  --   APTT 29  --   --   --   LABPROT 12.9  --   --   --   INR 1.0  --   --   --   HEPARINUNFRC  --  0.47 0.58 0.18*  CREATININE 1.06  --  1.21  --   TROPONINI <0.03 <0.03 <0.03 <0.03    Assessment/Plan:  68yo male therapeutic on heparin with initial dosing for CP.  F/u heparin level low, but it appears heparin had been stopped after stress test was negative this AM.  Pharmacy will sign-off.   Marguerite Olea, [facility] Clinical Pharmacist Phone [phone]  07/02/2018 1:37 PM

## 2018-07-02 NOTE — Care Management Obs Status (Signed)
[facility] NOTIFICATION   Patient Details  Name: Nicholas Carroll MRN: [id number] Date of Birth: 1950/04/14   Medicare Observation Status Notification Given:  Yes    Candie Chroman, LCSW 07/02/2018, 3:26 PM

## 2018-07-02 NOTE — TOC Initial Note (Signed)
Transition of Care [facility]) - Initial/Assessment Note    Patient Details  Name: Nicholas Carroll MRN: [id number] Date of Birth: 08-30-1950  Transition of Care [facility]) CM/SW Contact:    Candie Chroman, LCSW Phone Number: 07/02/2018, 3:36 PM  Clinical Narrative: Patient is independent from home with his wife. No home health/DME prior to admission. Patient has no transportation issues. He is sometimes able to make his own appointments. His wife and daughter assist with this as needed. Patient voiced no concerns/needs.          Expected Discharge Plan: Home/Self Care Barriers to Discharge: Continued Medical Work up   Patient Goals and CMS Choice        Expected Discharge Plan and Services Expected Discharge Plan: Home/Self Care   Discharge Planning Services: NA Post Acute Care Choice: NA Living arrangements for the past 2 months: Single Family Home                 DME Arranged: N/A DME Agency: NA HH Arranged: NA HH Agency: NA  Prior Living Arrangements/Services Living arrangements for the past 2 months: Single Family Home Lives with:: Spouse Patient language and need for interpreter reviewed:: No Do you feel safe going back to the place where you live?: Yes      Need for Family Participation in Patient Care: No (Comment) Care giver support system in place?: Yes (comment)(Wife and daughter) Current home services: Other (comment)(None) Criminal Activity/Legal Involvement Pertinent to Current Situation/Hospitalization: No - Comment as needed  Activities of Daily Living Home Assistive Devices/Equipment: Eyeglasses ADL Screening (condition at time of admission) Patient's cognitive ability adequate to safely complete daily activities?: Yes Is the patient deaf or have difficulty hearing?: No Does the patient have difficulty seeing, even when wearing glasses/contacts?: No Does the patient have difficulty concentrating, remembering, or making decisions?: No Patient able to express  need for assistance with ADLs?: Yes Does the patient have difficulty dressing or bathing?: No Independently performs ADLs?: Yes (appropriate for developmental age) Does the patient have difficulty walking or climbing stairs?: No Weakness of Legs: None Weakness of Arms/Hands: None  Permission Sought/Granted   Permission granted to share information with : No              Emotional Assessment Appearance:: Appears stated age Attitude/Demeanor/Rapport: Engaged, Gracious Affect (typically observed): Accepting, Appropriate, Calm, Pleasant Orientation: : Oriented to Self, Oriented to Place, Oriented to  Time, Oriented to Situation Alcohol / Substance Use: Never Used Psych Involvement: No (comment)  Admission diagnosis:  chest pain Patient Active Problem List   Diagnosis Date Noted  . Acute coronary syndrome ([place]) 07/01/2018  . Essential hypertension 12/23/2014  . Atypical chest pain 12/23/2014  . Coronary artery disease involving native coronary artery 12/23/2014  . Chest pain at rest 12/23/2014  . Leg pain    PCP:  Iona Beard, MD Pharmacy:   [place], [place] - [address] [place] [address] Phone: [phone] Fax: [phone]  [id number] [place], [place] [address]  [place] [address] Phone: [phone] Fax: [phone]     Social Determinants of Health (SDOH) Interventions    Readmission Risk Interventions No flowsheet data found.

## 2018-07-03 DIAGNOSIS — E669 Obesity, unspecified: Secondary | ICD-10-CM | POA: Diagnosis not present

## 2018-07-03 DIAGNOSIS — I10 Essential (primary) hypertension: Secondary | ICD-10-CM | POA: Diagnosis not present

## 2018-07-03 DIAGNOSIS — I249 Acute ischemic heart disease, unspecified: Secondary | ICD-10-CM | POA: Diagnosis not present

## 2018-07-03 DIAGNOSIS — E785 Hyperlipidemia, unspecified: Secondary | ICD-10-CM | POA: Diagnosis not present

## 2018-07-03 LAB — CBC
HCT: 39 % (ref 39.0–52.0)
HEMOGLOBIN: 13.6 g/dL (ref 13.0–17.0)
MCH: 29.8 pg (ref 26.0–34.0)
MCHC: 34.9 g/dL (ref 30.0–36.0)
MCV: 85.3 fL (ref 80.0–100.0)
Platelets: 159 10*3/uL (ref 150–400)
RBC: 4.57 MIL/uL (ref 4.22–5.81)
RDW: 14.1 % (ref 11.5–15.5)
WBC: 3.6 10*3/uL — ABNORMAL LOW (ref 4.0–10.5)
nRBC: 0 % (ref 0.0–0.2)

## 2018-07-03 MED ORDER — AMLODIPINE BESYLATE 5 MG PO TABS: 5.0000 mg | ORAL_TABLET | Freq: Every day | ORAL | 3 refills | Status: AC

## 2018-07-03 MED ORDER — ATORVASTATIN CALCIUM 40 MG PO TABS: 40.0000 mg | ORAL_TABLET | Freq: Every day | ORAL | 3 refills | Status: DC

## 2018-07-03 MED ORDER — CARVEDILOL 3.125 MG PO TABS: 3.1250 mg | ORAL_TABLET | Freq: Two times a day (BID) | ORAL | 3 refills | Status: DC

## 2018-07-03 MED ORDER — ISOSORBIDE MONONITRATE ER 30 MG PO TB24: 15.0000 mg | ORAL_TABLET | Freq: Every day | ORAL | 3 refills | Status: DC

## 2018-07-03 MED ORDER — NITROGLYCERIN 0.4 MG SL SUBL: 0.4000 mg | SUBLINGUAL_TABLET | SUBLINGUAL | 1 refills | Status: AC | PRN

## 2018-07-03 NOTE — Discharge Summary (Signed)
Physician Discharge Summary  Patient ID: DONTARIO EVETTS MRN: [id number] DOB/AGE: January 14, 1951 68 y.o.  Admit date: 07/01/2018 Discharge date: 07/03/2018  Admission Diagnoses: Acute coronary syndrome HTN Hyperlipidemia Obesity CAD S/P stent  Discharge Diagnoses:  Principal Problem:   Unstable angina Active Problems:   Essential hypertension   Coronary artery disease involving native coronary artery   Hyperlipidemia   Obesity   S/P stent  Discharged Condition: fair  Hospital Course: 68 year old male presented with recurrent chest pain. His troponin I levels were normal. He underwent nuclear myocardial perfusion stress test that showed global hypokinesia with EF 41 % and lack of reversible ischemia. He was treated with carvedilol, amlodipine and isosorbide mononitrate with some relief. His atorvastatin dose was increased to 40 mg. one daily. He will finish antibiotics for his colitis.  He will see me in 1 week.  Consults: cardiology  Significant Diagnostic Studies: labs: Chronic leukopenia, normal Hgb and platelets counts. Troponin I levels are normal. Near normal BMET.  Elevated LDL cholesterol of 120 mg. and HDL level of 35 mg, and Triglyceride of 72 mg.   EKG: SR + LVH + Early repolarization CXR: Near normal on 06/27/2018. CT abdomen prior to admission: Mild Colitis, aortic atherosclerosis and hepatic steatosis. NM myocardial perfusion test: Negative for reversible ischemia.  Treatments: cardiac meds: lisinopril (Zestril), carvedilol, amlodipine, atorvastatin and Imdur  Discharge Exam: Blood pressure 100/67, pulse (!) 57, temperature 100.1 F (37.8 C), temperature source Oral, resp. rate 18, height 6\' 3"  (1.905 m), weight 112.8 kg, SpO2 98 %. General appearance: alert, cooperative and appears stated age. Head: Normocephalic, atraumatic. Eyes: Brown eyes, pink conjunctiva, corneas clear. PERRL, EOM's intact.  Neck: No adenopathy, no carotid bruit, no JVD, supple,  symmetrical, trachea midline and thyroid not enlarged. Resp: Clear to auscultation bilaterally. Cardio: Regular rate and rhythm, S1, S2 normal, II/VI systolic murmur, no click, rub or gallop. GI: Soft, non-tender; bowel sounds normal; no organomegaly. Extremities: Trace edema, no cyanosis or clubbing. Skin: Warm and dry.  Neurologic: Alert and oriented X 3, normal strength and tone. Normal coordination and gait.  Disposition: Discharge disposition: 01-Home or Self Care        Allergies as of 07/03/2018   No Known Allergies     Medication List    TAKE these medications   amLODipine 5 MG tablet Commonly known as:  NORVASC Take 1 tablet (5 mg total) by mouth daily. Start taking on:  July 04, 2018   aspirin 81 MG tablet Take 81 mg by mouth daily.   atorvastatin 40 MG tablet Commonly known as:  LIPITOR Take 1 tablet (40 mg total) by mouth daily at 6 PM. What changed:    medication strength  how much to take  when to take this   carvedilol 3.125 MG tablet Commonly known as:  COREG Take 1 tablet (3.125 mg total) by mouth 2 (two) times daily with a meal.   ciprofloxacin 500 MG tablet Commonly known as:  Cipro Take 1 tablet (500 mg total) by mouth 2 (two) times daily. One po bid x 7 days What changed:  additional instructions   HYDROcodone-acetaminophen 5-325 MG tablet Commonly known as:  NORCO/VICODIN Take 1 tablet by mouth every 6 (six) hours as needed for severe pain.   isosorbide mononitrate 30 MG 24 hr tablet Commonly known as:  IMDUR Take 0.5 tablets (15 mg total) by mouth daily. Start taking on:  July 04, 2018   lisinopril 10 MG tablet Commonly known as:  PRINIVIL,ZESTRIL  Take 1 tablet (10 mg total) by mouth daily.   metroNIDAZOLE 500 MG tablet Commonly known as:  Flagyl Take 1 tablet (500 mg total) by mouth 2 (two) times daily. One po bid x 7 days What changed:  additional instructions   nitroGLYCERIN 0.4 MG SL tablet Commonly known as:   NITROSTAT Place 1 tablet (0.4 mg total) under the tongue every 5 (five) minutes x 3 doses as needed for chest pain.      Follow-up Information    Iona Beard, MD. Schedule an appointment as soon as possible for a visit in 1 week(s).   Specialty:  Family Medicine Contact information: [address] [place] [address] [phone]        Dixie Dials, MD. Schedule an appointment as soon as possible for a visit in 1 month(s).   Specialty:  Cardiology Contact information: [place] [address] [phone]           Signed: Birdie Riddle 07/03/2018, 10:27 AM

## 2019-01-05 ENCOUNTER — Other Ambulatory Visit: Payer: Self-pay

## 2019-01-05 DIAGNOSIS — Z20822 Contact with and (suspected) exposure to covid-19: Secondary | ICD-10-CM

## 2019-01-06 LAB — NOVEL CORONAVIRUS, NAA: SARS-CoV-2, NAA: NOT DETECTED

## 2019-01-07 ENCOUNTER — Telehealth: Payer: Self-pay | Admitting: Family Medicine

## 2019-01-07 NOTE — Telephone Encounter (Signed)
Patient returned call and received his covid test result

## 2019-02-19 ENCOUNTER — Other Ambulatory Visit: Payer: Self-pay

## 2019-02-19 DIAGNOSIS — Z20822 Contact with and (suspected) exposure to covid-19: Secondary | ICD-10-CM

## 2019-02-22 LAB — NOVEL CORONAVIRUS, NAA: SARS-CoV-2, NAA: NOT DETECTED

## 2020-02-08 ENCOUNTER — Other Ambulatory Visit: Payer: Self-pay | Admitting: Urology

## 2020-02-08 DIAGNOSIS — C61 Malignant neoplasm of prostate: Secondary | ICD-10-CM

## 2020-03-11 ENCOUNTER — Other Ambulatory Visit: Payer: Medicare Other

## 2020-04-15 ENCOUNTER — Ambulatory Visit
Admission: RE | Admit: 2020-04-15 | Discharge: 2020-04-15 | Disposition: A | Discharge status: Home / Self Care | Payer: TRICARE For Life (TFL) | Source: Ambulatory Visit | Attending: Urology | Admitting: Urology

## 2020-04-15 ENCOUNTER — Other Ambulatory Visit: Payer: Self-pay

## 2020-04-15 DIAGNOSIS — C61 Malignant neoplasm of prostate: Secondary | ICD-10-CM

## 2020-04-15 MED ORDER — GADOBENATE DIMEGLUMINE 529 MG/ML IV SOLN
20.0000 mL | Freq: Once | INTRAVENOUS | Status: AC | PRN
  Administered 2020-04-15: 20 mL via INTRAVENOUS

## 2020-09-21 ENCOUNTER — Emergency Department (HOSPITAL_COMMUNITY): Payer: Medicare PPO

## 2020-09-21 ENCOUNTER — Emergency Department (HOSPITAL_COMMUNITY)
Admission: EM | Admit: 2020-09-21 | Discharge: 2020-09-21 | Disposition: A | Discharge status: Home / Self Care | Payer: Medicare PPO | Attending: Emergency Medicine | Admitting: Emergency Medicine

## 2020-09-21 ENCOUNTER — Encounter (HOSPITAL_COMMUNITY): Payer: Self-pay

## 2020-09-21 ENCOUNTER — Other Ambulatory Visit: Payer: Self-pay

## 2020-09-21 DIAGNOSIS — M25562 Pain in left knee: Secondary | ICD-10-CM | POA: Diagnosis not present

## 2020-09-21 DIAGNOSIS — I2510 Atherosclerotic heart disease of native coronary artery without angina pectoris: Secondary | ICD-10-CM | POA: Insufficient documentation

## 2020-09-21 DIAGNOSIS — Z7982 Long term (current) use of aspirin: Secondary | ICD-10-CM | POA: Diagnosis not present

## 2020-09-21 DIAGNOSIS — I10 Essential (primary) hypertension: Secondary | ICD-10-CM | POA: Insufficient documentation

## 2020-09-21 DIAGNOSIS — Z87891 Personal history of nicotine dependence: Secondary | ICD-10-CM | POA: Diagnosis not present

## 2020-09-21 DIAGNOSIS — W010XXA Fall on same level from slipping, tripping and stumbling without subsequent striking against object, initial encounter: Secondary | ICD-10-CM | POA: Insufficient documentation

## 2020-09-21 DIAGNOSIS — Z79899 Other long term (current) drug therapy: Secondary | ICD-10-CM | POA: Insufficient documentation

## 2020-09-21 MED ORDER — OXYCODONE-ACETAMINOPHEN 5-325 MG PO TABS: 1.0000 | ORAL_TABLET | Freq: Three times a day (TID) | ORAL | 0 refills | Status: DC | PRN

## 2020-09-21 MED ORDER — OXYCODONE-ACETAMINOPHEN 5-325 MG PO TABS
1.0000 | ORAL_TABLET | Freq: Once | ORAL | Status: AC
  Administered 2020-09-21: 1 via ORAL
  Filled 2020-09-21: qty 1

## 2020-09-21 MED ORDER — OXYCODONE-ACETAMINOPHEN 5-325 MG PO TABS: 1.0000 | ORAL_TABLET | Freq: Three times a day (TID) | ORAL | 0 refills | Status: AC | PRN

## 2020-09-21 NOTE — Discharge Instructions (Addendum)
Your imaging was negative for a fracture.  Possible that you may have strained your muscle and/or partially tear a ligament.  I placed you in a knee brace please leave on during the day he may take off at nighttime.  Please remain nonweightbearing.  I have given you prescription for pain medications please be aware that this medication can make you drowsy do not consume alcohol or operate heavy machinery when taking this medication.  This medication has Tylenol in it do not take Tylenol when taking this medication.  Please follow-up with your orthopedic surgeon for further evaluation.  Come back to the emergency department if you develop chest pain, shortness of breath, severe abdominal pain, uncontrolled nausea, vomiting, diarrhea.

## 2020-09-21 NOTE — ED Triage Notes (Signed)
Pt reports falling yesterday and injuring his left knee. Pt denies any other injuries.

## 2020-09-21 NOTE — ED Provider Notes (Signed)
[place] DEPT Provider Note   CSN: [id number] Arrival date & time: 09/21/20  1152     History Chief Complaint  Patient presents with   Lytle Michaels    DEWAYNE SEVERE is a 70 y.o. male.  HPI  Patient with significant medical history of CAD, hyperlipidemia, hypertension, obesity presents to the emergency department with chief complaint of left knee pain.  Patient states yesterday he was out unloading a truck and unfortunately slipped twisting his left knee.  Patient states he was able to catch himself before actually falling.  He denies hitting his head, losing conscious, is not on anticoagulant.  Patient states he was unable to bear weight on his knee after the incident, states the pain is severe when he flexes or extends it, he denies  paresthesias or weakness in his lower extremities.  He states he still unable to bear weight on it today.  States has been icing it and applied a brace without any significant relief.  Patient denies alleviating factors.  Patient denies headaches, fevers, chills, shortness of breath or chest pain.  Past Medical History:  Diagnosis Date   Chest pain 06/2018   Coronary artery disease    Hyperlipidemia    Hypertension    Obesity    Sexual dysfunction     Patient Active Problem List   Diagnosis Date Noted   Acute coronary syndrome ([place]) 07/01/2018   Essential hypertension 12/23/2014   Atypical chest pain 12/23/2014   Coronary artery disease involving native coronary artery 12/23/2014   Chest pain at rest 12/23/2014   Leg pain     Past Surgical History:  Procedure Laterality Date   CARDIAC CATHETERIZATION N/A 12/23/2014   Procedure: Left Heart Cath and Coronary Angiography;  Surgeon: Dixie Dials, MD;  Location: [facility];  Service: Cardiovascular;  Laterality: N/A;   CORONARY STENT INTERVENTION     KNEE SURGERY         Family History  Problem Relation Age of Onset   Other Other        Pacemaker     Social History   Tobacco Use   Smoking status: Former    Pack years: 0.00   Smokeless tobacco: Never  Vaping Use   Vaping Use: Never used  Substance Use Topics   Alcohol use: No   Drug use: No    Home Medications Prior to Admission medications   Medication Sig Start Date End Date Taking? Authorizing Provider  oxyCODONE-acetaminophen (PERCOCET/ROXICET) 5-325 MG tablet Take 1 tablet by mouth every 8 (eight) hours as needed for up to 4 days for severe pain. 09/21/20 09/25/20 Yes Marcello Fennel, PA-C  amLODipine (NORVASC) 5 MG tablet Take 1 tablet (5 mg total) by mouth daily. 07/04/18   Dixie Dials, MD  aspirin 81 MG tablet Take 81 mg by mouth daily.    [provider]  atorvastatin (LIPITOR) 40 MG tablet Take 1 tablet (40 mg total) by mouth daily at 6 PM. 07/03/18   Dixie Dials, MD  carvedilol (COREG) 3.125 MG tablet Take 1 tablet (3.125 mg total) by mouth 2 (two) times daily with a meal. 07/03/18   Dixie Dials, MD  ciprofloxacin (CIPRO) 500 MG tablet Take 1 tablet (500 mg total) by mouth 2 (two) times daily. One po bid x 7 days Patient taking differently: Take 500 mg by mouth 2 (two) times daily. For seven days. Course started on 06-27-18 06/27/18   Drenda Freeze, MD  HYDROcodone-acetaminophen (NORCO/VICODIN) [phone]  MG tablet Take 1 tablet by mouth every 6 (six) hours as needed for severe pain. 06/27/18   Drenda Freeze, MD  isosorbide mononitrate (IMDUR) 30 MG 24 hr tablet Take 0.5 tablets (15 mg total) by mouth daily. 07/04/18   Dixie Dials, MD  lisinopril (PRINIVIL,ZESTRIL) 10 MG tablet Take 1 tablet (10 mg total) by mouth daily. 06/27/18   Drenda Freeze, MD  metroNIDAZOLE (FLAGYL) 500 MG tablet Take 1 tablet (500 mg total) by mouth 2 (two) times daily. One po bid x 7 days Patient taking differently: Take 500 mg by mouth 2 (two) times daily. For seven days. Course started on 06-27-18 06/27/18   Drenda Freeze, MD  nitroGLYCERIN (NITROSTAT) 0.4 MG SL  tablet Place 1 tablet (0.4 mg total) under the tongue every 5 (five) minutes x 3 doses as needed for chest pain. 07/03/18   Dixie Dials, MD    Allergies    Patient has no known allergies.  Review of Systems   Review of Systems  Constitutional:  Negative for chills and fever.  HENT:  Negative for congestion.   Respiratory:  Negative for shortness of breath.   Cardiovascular:  Negative for chest pain.  Gastrointestinal:  Negative for abdominal pain.  Genitourinary:  Negative for enuresis.  Musculoskeletal:  Negative for back pain.       Left knee pain.  Skin:  Negative for rash.  Neurological:  Negative for facial asymmetry.  Hematological:  Does not bruise/bleed easily.   Physical Exam Updated Vital Signs BP (!) 147/76 (BP Location: Left Arm)   Pulse 64   Temp 98.5 F (36.9 C) (Oral)   Resp 18   SpO2 98%   Physical Exam Vitals and nursing note reviewed.  Constitutional:      General: He is not in acute distress.    Appearance: He is not ill-appearing.  HENT:     Head: Normocephalic and atraumatic.     Nose: No congestion.  Eyes:     Conjunctiva/sclera: Conjunctivae normal.  Cardiovascular:     Rate and Rhythm: Normal rate and regular rhythm.     Pulses: Normal pulses.  Pulmonary:     Effort: Pulmonary effort is normal.  Musculoskeletal:        General: Tenderness present.     Comments: Patient's left knee was visualized there is no edema or erythema present, no lacerations or abrasions, patient was unable to actively flex or extend the knee without significant pain, was able to flex and extend with passive motion, he had full range of motion in his toes and ankle, neurovascular fully intact.  There is no joint laxity present, he was  tender in the medial aspect of the tibial plateau.  Skin:    General: Skin is warm and dry.  Neurological:     Mental Status: He is alert.  Psychiatric:        Mood and Affect: Mood normal.    ED Results / Procedures / Treatments    Labs (all labs ordered are listed, but only abnormal results are displayed) Labs Reviewed - No data to display  EKG None  Radiology DG Knee Complete 4 Views Left  Result Date: 09/21/2020 CLINICAL DATA:  Left lateral knee pain.  Fall yesterday. EXAM: LEFT KNEE - COMPLETE 4+ VIEW COMPARISON:  None. FINDINGS: No acute fracture, dislocation, or knee joint effusion is identified. There is moderate to severe medial compartment joint space narrowing, and there is prominent tricompartmental marginal osteophytosis. The soft tissues are  unremarkable. IMPRESSION: 1. No acute osseous abnormality identified. 2. Tricompartmental osteoarthrosis. Electronically Signed   By: Logan Bores M.D.   On: 09/21/2020 12:42    Procedures Procedures   Medications Ordered in ED Medications  oxyCODONE-acetaminophen (PERCOCET/ROXICET) 5-325 MG per tablet 1 tablet (has no administration in time range)    ED Course  I have reviewed the triage vital signs and the nursing notes.  Pertinent labs & imaging results that were available during my care of the patient were reviewed by me and considered in my medical decision making (see chart for details).    MDM Rules/Calculators/A&P                         Initial impression-patient presents with left knee pain.  He is alert, does not appear in acute distress, vital signs reassuring.Suspect possible muscular strain versus ligament damage But cannot exclude possible of a fracture will obtain imaging for further evaluation.  Work-up-DG of knee reveals no acute abnormalities, tricompartment osteoarthritis.  Present  Reassessment-patient was updated on imaging, he has no complaints this time, vital signs remained stable patient is agreeable for discharge.  Rule out- I have low suspicion for septic arthritis as patient denies IV drug use, skin exam was performed no erythematous, edematous, warm joints noted on exam, no new heart murmur heard on exam.  Low suspicion for  fracture or dislocation as x-ray does not feel any significant findings. low suspicion for ligament or tendon damage as area was palpated no gross defects noted, he had full range of motion with passive movement,  unable to fully extend or flex with active range of motion at knee  suspect this is secondary due to pain, Did not feel any joint laxity..  Low suspicion for compartment syndrome as area was palpated it was soft to the touch, neurovascular fully intact.   Plan- 1 knee pain-suspect secondary due to muscular strain but cannot exclude the possibility of a ligament tear, will place him in a knee brace, make him nonweightbearing, and have him follow-up with orthopedic surgery for further evaluation.  Vital signs have remained stable, no indication for hospital admission.    Patient given at home care as well strict return precautions.  Patient verbalized that they understood agreed to said plan.  Final Clinical Impression(s) / ED Diagnoses Final diagnoses:  Acute pain of left knee    Rx / DC Orders ED Discharge Orders          Ordered    oxyCODONE-acetaminophen (PERCOCET/ROXICET) 5-325 MG tablet  Every 8 hours PRN        09/21/20 1314             Aron Baba 09/21/20 1316    Isla Pence, MD 09/21/20 1427

## 2021-01-03 ENCOUNTER — Emergency Department (HOSPITAL_COMMUNITY): Payer: Medicare PPO

## 2021-01-03 ENCOUNTER — Inpatient Hospital Stay (HOSPITAL_COMMUNITY)
Admission: EM | Admit: 2021-01-03 | Discharge: 2021-01-06 | DRG: 287 | Disposition: A | Discharge status: Home / Self Care | Payer: Medicare PPO | Attending: Cardiovascular Disease | Admitting: Cardiovascular Disease

## 2021-01-03 ENCOUNTER — Other Ambulatory Visit: Payer: Self-pay

## 2021-01-03 ENCOUNTER — Encounter (HOSPITAL_COMMUNITY): Payer: Self-pay | Admitting: Cardiovascular Disease

## 2021-01-03 DIAGNOSIS — Z87891 Personal history of nicotine dependence: Secondary | ICD-10-CM | POA: Diagnosis not present

## 2021-01-03 DIAGNOSIS — Z20822 Contact with and (suspected) exposure to covid-19: Secondary | ICD-10-CM | POA: Diagnosis present

## 2021-01-03 DIAGNOSIS — I249 Acute ischemic heart disease, unspecified: Secondary | ICD-10-CM | POA: Diagnosis present

## 2021-01-03 DIAGNOSIS — Z23 Encounter for immunization: Secondary | ICD-10-CM | POA: Diagnosis not present

## 2021-01-03 DIAGNOSIS — R001 Bradycardia, unspecified: Secondary | ICD-10-CM | POA: Diagnosis present

## 2021-01-03 DIAGNOSIS — I200 Unstable angina: Secondary | ICD-10-CM | POA: Diagnosis present

## 2021-01-03 DIAGNOSIS — E785 Hyperlipidemia, unspecified: Secondary | ICD-10-CM | POA: Diagnosis present

## 2021-01-03 DIAGNOSIS — I10 Essential (primary) hypertension: Secondary | ICD-10-CM | POA: Diagnosis present

## 2021-01-03 DIAGNOSIS — E669 Obesity, unspecified: Secondary | ICD-10-CM | POA: Diagnosis present

## 2021-01-03 DIAGNOSIS — I25110 Atherosclerotic heart disease of native coronary artery with unstable angina pectoris: Principal | ICD-10-CM | POA: Diagnosis present

## 2021-01-03 LAB — HEPATIC FUNCTION PANEL
ALT: 27 U/L (ref 0–44)
AST: 37 U/L (ref 15–41)
Albumin: 3.4 g/dL — ABNORMAL LOW (ref 3.5–5.0)
Alkaline Phosphatase: 57 U/L (ref 38–126)
Bilirubin, Direct: 0.4 mg/dL — ABNORMAL HIGH (ref 0.0–0.2)
Indirect Bilirubin: 1.1 mg/dL — ABNORMAL HIGH (ref 0.3–0.9)
Total Bilirubin: 1.5 mg/dL — ABNORMAL HIGH (ref 0.3–1.2)
Total Protein: 6.6 g/dL (ref 6.5–8.1)

## 2021-01-03 LAB — RESP PANEL BY RT-PCR (FLU A&B, COVID) ARPGX2
Influenza A by PCR: NEGATIVE
Influenza B by PCR: NEGATIVE
SARS Coronavirus 2 by RT PCR: NEGATIVE

## 2021-01-03 LAB — CBC
HCT: 41.2 % (ref 39.0–52.0)
Hemoglobin: 13.9 g/dL (ref 13.0–17.0)
MCH: 29.8 pg (ref 26.0–34.0)
MCHC: 33.7 g/dL (ref 30.0–36.0)
MCV: 88.2 fL (ref 80.0–100.0)
Platelets: 209 10*3/uL (ref 150–400)
RBC: 4.67 MIL/uL (ref 4.22–5.81)
RDW: 14.4 % (ref 11.5–15.5)
WBC: 4.1 10*3/uL (ref 4.0–10.5)
nRBC: 0 % (ref 0.0–0.2)

## 2021-01-03 LAB — BASIC METABOLIC PANEL
Anion gap: 5 (ref 5–15)
BUN: 9 mg/dL (ref 8–23)
CO2: 28 mmol/L (ref 22–32)
Calcium: 9.2 mg/dL (ref 8.9–10.3)
Chloride: 104 mmol/L (ref 98–111)
Creatinine, Ser: 1.07 mg/dL (ref 0.61–1.24)
GFR, Estimated: >60 mL/min (ref >60)
Glucose, Bld: 114 mg/dL — ABNORMAL HIGH (ref 70–99)
Potassium: 3.5 mmol/L (ref 3.5–5.1)
Sodium: 137 mmol/L (ref 135–145)

## 2021-01-03 LAB — HIV ANTIBODY (ROUTINE TESTING W REFLEX): HIV Screen 4th Generation wRfx: NONREACTIVE

## 2021-01-03 LAB — PROTIME-INR
INR: 1.1 (ref 0.8–1.2)
Prothrombin Time: 13.9 seconds (ref 11.4–15.2)

## 2021-01-03 LAB — TROPONIN I (HIGH SENSITIVITY)
Troponin I (High Sensitivity): 41 ng/L — ABNORMAL HIGH (ref <18)
Troponin I (High Sensitivity): 40 ng/L — ABNORMAL HIGH

## 2021-01-03 LAB — APTT: aPTT: 87 seconds — ABNORMAL HIGH (ref 24–36)

## 2021-01-03 LAB — HEPARIN LEVEL (UNFRACTIONATED): Heparin Unfractionated: 0.56 IU/mL (ref 0.30–0.70)

## 2021-01-03 MED ORDER — ATORVASTATIN CALCIUM 40 MG PO TABS
40.0000 mg | ORAL_TABLET | Freq: Every day | ORAL | Status: DC
  Administered 2021-01-03 – 2021-01-05 (×3): 40 mg via ORAL
  Filled 2021-01-03 (×4): qty 1

## 2021-01-03 MED ORDER — SODIUM CHLORIDE 0.9 % IV SOLN: INTRAVENOUS | Status: DC

## 2021-01-03 MED ORDER — ASPIRIN 81 MG PO CHEW
324.0000 mg | CHEWABLE_TABLET | Freq: Once | ORAL | Status: AC
  Administered 2021-01-03: 324 mg via ORAL
  Filled 2021-01-03: qty 4

## 2021-01-03 MED ORDER — SODIUM CHLORIDE 0.9% FLUSH
3.0000 mL | Freq: Two times a day (BID) | INTRAVENOUS | Status: DC
  Administered 2021-01-03 – 2021-01-04 (×2): 3 mL via INTRAVENOUS

## 2021-01-03 MED ORDER — ONDANSETRON HCL 4 MG/2ML IJ SOLN
4.0000 mg | Freq: Once | INTRAMUSCULAR | Status: AC
  Administered 2021-01-03: 4 mg via INTRAVENOUS
  Filled 2021-01-03: qty 2

## 2021-01-03 MED ORDER — SODIUM CHLORIDE 0.9 % IV SOLN: 250.0000 mL | INTRAVENOUS | Status: DC | PRN

## 2021-01-03 MED ORDER — ONDANSETRON HCL 4 MG/2ML IJ SOLN: 4.0000 mg | Freq: Four times a day (QID) | INTRAMUSCULAR | Status: DC | PRN

## 2021-01-03 MED ORDER — HEPARIN (PORCINE) 25000 UT/250ML-% IV SOLN
1400.0000 [IU]/h | INTRAVENOUS | Status: DC
  Administered 2021-01-03 – 2021-01-04 (×2): 1400 [IU]/h via INTRAVENOUS
  Filled 2021-01-03 (×2): qty 250

## 2021-01-03 MED ORDER — PNEUMOCOCCAL VAC POLYVALENT 25 MCG/0.5ML IJ INJ
0.5000 mL | INJECTION | INTRAMUSCULAR | Status: AC
Start: 2021-01-04 — End: 2021-01-06
  Administered 2021-01-06: 0.5 mL via INTRAMUSCULAR
  Filled 2021-01-03: qty 0.5

## 2021-01-03 MED ORDER — ASPIRIN 300 MG RE SUPP: 300.0000 mg | RECTAL | Status: DC

## 2021-01-03 MED ORDER — NITROGLYCERIN 0.4 MG SL SUBL: 0.4000 mg | SUBLINGUAL_TABLET | SUBLINGUAL | Status: DC | PRN

## 2021-01-03 MED ORDER — SODIUM CHLORIDE 0.9% FLUSH: 3.0000 mL | Freq: Two times a day (BID) | INTRAVENOUS | Status: DC

## 2021-01-03 MED ORDER — ACETAMINOPHEN 325 MG PO TABS
650.0000 mg | ORAL_TABLET | ORAL | Status: DC | PRN
  Administered 2021-01-04: 650 mg via ORAL
  Filled 2021-01-03: qty 2

## 2021-01-03 MED ORDER — ASPIRIN EC 81 MG PO TBEC
81.0000 mg | DELAYED_RELEASE_TABLET | Freq: Every day | ORAL | Status: DC
  Administered 2021-01-04 – 2021-01-06 (×3): 81 mg via ORAL
  Filled 2021-01-03 (×3): qty 1

## 2021-01-03 MED ORDER — SODIUM CHLORIDE 0.9% FLUSH
3.0000 mL | INTRAVENOUS | Status: DC | PRN
  Administered 2021-01-03: 3 mL via INTRAVENOUS

## 2021-01-03 MED ORDER — MORPHINE SULFATE (PF) 4 MG/ML IV SOLN
4.0000 mg | Freq: Once | INTRAVENOUS | Status: AC
  Administered 2021-01-03: 4 mg via INTRAVENOUS
  Filled 2021-01-03: qty 1

## 2021-01-03 MED ORDER — ASPIRIN 81 MG PO CHEW: 324.0000 mg | CHEWABLE_TABLET | ORAL | Status: DC

## 2021-01-03 MED ORDER — SODIUM CHLORIDE 0.9% FLUSH: 3.0000 mL | INTRAVENOUS | Status: DC | PRN

## 2021-01-03 MED ORDER — HEPARIN BOLUS VIA INFUSION
4000.0000 [IU] | Freq: Once | INTRAVENOUS | Status: AC
  Administered 2021-01-03: 4000 [IU] via INTRAVENOUS
  Filled 2021-01-03: qty 4000

## 2021-01-03 MED ORDER — INFLUENZA VAC A&B SA ADJ QUAD 0.5 ML IM PRSY
0.5000 mL | PREFILLED_SYRINGE | INTRAMUSCULAR | Status: AC
  Administered 2021-01-06: 0.5 mL via INTRAMUSCULAR
  Filled 2021-01-03: qty 0.5

## 2021-01-03 NOTE — ED Provider Notes (Signed)
Emergency Medicine Provider Triage Evaluation Note  Nicholas Carroll , a 70 y.o. male  was evaluated in triage.  Pt complains of chest pain.  The patient reports bilateral chest pain that began 1 week ago.  Pain does not radiate. He came for evaluation the emergency department today because the pain has not resolved.  He denies worsening pain.  He states that pain is worse with lifting his arms, bending over, and with coughing.  He has had a mild cough for the last few days.  Pain improves with rest.  He rates the current pain is 9 out of 10.  He has intermittently had some upper abdominal pain, but denies that at this time.  He does state that pain does feel similar to when he previously had an MI.  Is a history of 1 cardiac stent.  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  No fever, chills, nausea, vomiting, diarrhea, diaphoresis, dizziness, lightheadedness, leg swelling.  Review of Systems  Positive: Chest pain, cough Negative: Fever, chills, nausea, vomiting, diaphoresis, dizziness, lightheadedness, leg swelling  Physical Exam  BP 130/83 (BP Location: Right Arm)   Pulse (!) 57   Temp 98.2 F (36.8 C) (Oral)   Resp 20   Ht 6\' 3"  (1.905 m)   Wt 113.4 kg   SpO2 99%   BMI 31.25 kg/m  Gen:   Awake, no distress   Resp:  Normal effort  MSK:   Moves extremities without difficulty  Other:  Heart is regular rate and rhythm.  Abdomen is nontender.  Medical Decision Making  Medically screening exam initiated at 6:43 AM.  Appropriate orders placed.  Nicholas Carroll was informed that the remainder of the evaluation will be completed by another provider, this initial triage assessment does not replace that evaluation, and the importance of remaining in the ED until their evaluation is complete.  Labs and imaging have been ordered.  He will require further work-up evaluation the emergency department.   Joanne Gavel,  PA-C 01/03/21 0867    Ripley Fraise, MD 01/03/21 [phone]

## 2021-01-03 NOTE — ED Triage Notes (Signed)
Pt c/o generalized chest pain rating pain 9 out of 10 ongoing for 1 week, pain worsening this morning. Pain worsening with movement and improves with rest. Cardiac HX with stent 2011.

## 2021-01-03 NOTE — Progress Notes (Signed)
ANTICOAGULATION CONSULT NOTE - Initial Consult  Pharmacy Consult for heparin Indication: chest pain/ACS  No Known Allergies  Patient Measurements: Height: 6\' 3"  (190.5 cm) Weight: 113.4 kg (250 lb) IBW/kg (Calculated) : 84.5 Heparin Dosing Weight: 108 kg  Vital Signs: Temp: 98.2 F (36.8 C) (09/28 0621) Temp Source: Oral (09/28 0621) BP: 122/77 (09/28 0832) Pulse Rate: 48 (09/28 0832)  Labs: Recent Labs    01/03/21 0632  HGB 13.9  HCT 41.2  PLT 209  CREATININE 1.07  TROPONINIHS 41*    Estimated Creatinine Clearance: 88.6 mL/min (by C-G formula based on SCr of 1.07 mg/dL).   Medical History: Past Medical History:  Diagnosis Date   Chest pain 06/2018   Coronary artery disease    Hyperlipidemia    Hypertension    Obesity    Sexual dysfunction     Medications: see MAR  Assessment:  70 years old male with known h/o CAD, HTN, HLD and Obesity has recurrent chest pain radiating across the chest for 1 week. Heparin consult for ACS/NSTEMI.  EKG is sinus bradycardia. Baseline CBC is wnl, no AC PTA.  Goal of Therapy:  Heparin level 0.3-0.7 units/ml Monitor platelets by anticoagulation protocol: Yes   Plan: Give 4000 units bolus x 1 Start heparin infusion at 1400 units/hr Check anti-Xa level in 6 hours and daily while on heparin Continue to monitor H&H and platelets  Joetta Manners, PharmD, [facility] Emergency Medicine Clinical Pharmacist ED RPh Phone: [place]: [phone]

## 2021-01-03 NOTE — ED Provider Notes (Addendum)
[facility] EMERGENCY DEPARTMENT Provider Note   CSN: [id number] Arrival date & time: 01/03/21  3151     History Chief Complaint  Patient presents with  . Chest Pain    Nicholas Carroll is a 70 y.o. male.  HPI   Patient with history of CAD, hyperlipidemia, hypertension presents with chest pain.  Is been intermittent for the last week, radiates in a bandlike distribution across his chest and does not move anywhere else.  Denies it feels sharp, other times it feels like a pressure.  Typically it will last for minutes, it is incited by moving or by coughing.  Patient states he coughs usually when he is cutting the grass due to allergies, denies any fever or congestion symptoms.  Has not tried any alleviating factors other than rest, exacerbating factors include exertion and coughing.  History of stent placement in 2011.  Most recent echo was in 2020 and had an LVEF of 41%.  Past Medical History:  Diagnosis Date  . Chest pain 06/2018  . Coronary artery disease   . Hyperlipidemia   . Hypertension   . Obesity   . Sexual dysfunction     Patient Active Problem List   Diagnosis Date Noted  . Acute coronary syndrome ([place]) 07/01/2018  . Essential hypertension 12/23/2014  . Atypical chest pain 12/23/2014  . Coronary artery disease involving native coronary artery 12/23/2014  . Chest pain at rest 12/23/2014  . Leg pain     Past Surgical History:  Procedure Laterality Date  . CARDIAC CATHETERIZATION N/A 12/23/2014   Procedure: Left Heart Cath and Coronary Angiography;  Surgeon: Dixie Dials, MD;  Location: [facility];  Service: Cardiovascular;  Laterality: N/A;  . CORONARY STENT INTERVENTION    . KNEE SURGERY         Family History  Problem Relation Age of Onset  . Other Other        Pacemaker    Social History   Tobacco Use  . Smoking status: Former  . Smokeless tobacco: Never  Vaping Use  . Vaping Use: Never used  Substance Use Topics  .  Alcohol use: No  . Drug use: No    Home Medications Prior to Admission medications   Medication Sig Start Date End Date Taking? Authorizing Provider  amLODipine (NORVASC) 5 MG tablet Take 1 tablet (5 mg total) by mouth daily. 07/04/18   Dixie Dials, MD  aspirin 81 MG tablet Take 81 mg by mouth daily.    [provider]  atorvastatin (LIPITOR) 40 MG tablet Take 1 tablet (40 mg total) by mouth daily at 6 PM. 07/03/18   Dixie Dials, MD  carvedilol (COREG) 3.125 MG tablet Take 1 tablet (3.125 mg total) by mouth 2 (two) times daily with a meal. 07/03/18   Dixie Dials, MD  ciprofloxacin (CIPRO) 500 MG tablet Take 1 tablet (500 mg total) by mouth 2 (two) times daily. One po bid x 7 days Patient taking differently: Take 500 mg by mouth 2 (two) times daily. For seven days. Course started on 06-27-18 06/27/18   Drenda Freeze, MD  HYDROcodone-acetaminophen (NORCO/VICODIN) 5-325 MG tablet Take 1 tablet by mouth every 6 (six) hours as needed for severe pain. 06/27/18   Drenda Freeze, MD  isosorbide mononitrate (IMDUR) 30 MG 24 hr tablet Take 0.5 tablets (15 mg total) by mouth daily. 07/04/18   Dixie Dials, MD  lisinopril (PRINIVIL,ZESTRIL) 10 MG tablet Take 1 tablet (10 mg total) by  mouth daily. 06/27/18   Drenda Freeze, MD  metroNIDAZOLE (FLAGYL) 500 MG tablet Take 1 tablet (500 mg total) by mouth 2 (two) times daily. One po bid x 7 days Patient taking differently: Take 500 mg by mouth 2 (two) times daily. For seven days. Course started on 06-27-18 06/27/18   Drenda Freeze, MD  nitroGLYCERIN (NITROSTAT) 0.4 MG SL tablet Place 1 tablet (0.4 mg total) under the tongue every 5 (five) minutes x 3 doses as needed for chest pain. 07/03/18   Dixie Dials, MD    Allergies    Patient has no known allergies.  Review of Systems   Review of Systems  Constitutional:  Negative for chills, fatigue and fever.  HENT:  Negative for ear pain and sore throat.   Eyes:  Negative for pain and  visual disturbance.  Respiratory:  Positive for cough. Negative for shortness of breath.   Cardiovascular:  Positive for chest pain. Negative for palpitations and leg swelling.  Gastrointestinal:  Negative for abdominal pain, nausea and vomiting.  Genitourinary:  Negative for dysuria and hematuria.  Musculoskeletal:  Negative for arthralgias and back pain.  Skin:  Negative for color change and rash.  Neurological:  Negative for seizures and syncope.  All other systems reviewed and are negative.  Physical Exam Updated Vital Signs BP 130/83 (BP Location: Right Arm)   Pulse (!) 57   Temp 98.2 F (36.8 C) (Oral)   Resp 20   Ht 6\' 3"  (1.905 m)   Wt 113.4 kg   SpO2 99%   BMI 31.25 kg/m   Physical Exam Vitals and nursing note reviewed. Exam conducted with a chaperone present.  Constitutional:      Appearance: Normal appearance.     Comments: Resting comfortably  HENT:     Head: Normocephalic and atraumatic.  Eyes:     General: No scleral icterus.       Right eye: No discharge.        Left eye: No discharge.     Extraocular Movements: Extraocular movements intact.     Pupils: Pupils are equal, round, and reactive to light.  Cardiovascular:     Rate and Rhythm: Regular rhythm. Bradycardia present.     Pulses: Normal pulses.     Heart sounds: Normal heart sounds. No murmur heard.   No friction rub. No gallop.  Pulmonary:     Effort: Pulmonary effort is normal. No respiratory distress.     Breath sounds: Normal breath sounds.     Comments: Speaking in complete sentences, lungs are clear to auscultation bilaterally Abdominal:     General: Abdomen is flat. Bowel sounds are normal. There is no distension.     Palpations: Abdomen is soft.     Tenderness: There is no abdominal tenderness.  Skin:    General: Skin is warm and dry.     Coloration: Skin is not jaundiced.  Neurological:     Mental Status: He is alert. Mental status is at baseline.     Coordination: Coordination  normal.    ED Results / Procedures / Treatments   Labs (all labs ordered are listed, but only abnormal results are displayed) Labs Reviewed  CBC  BASIC METABOLIC PANEL  DIFFERENTIAL  TROPONIN I (HIGH SENSITIVITY)    EKG EKG Interpretation  Date/Time:  Wednesday January 03 2021 06:26:39 EDT Ventricular Rate:  54 PR Interval:  162 QRS Duration: 92 QT Interval:  444 QTC Calculation: 421 R Axis:   21 Text Interpretation:  Sinus bradycardia Otherwise normal ECG Confirmed by Ripley Fraise [phone]) on 01/03/2021 6:29:58 AM  Radiology No results found.  Procedures Procedures   Medications Ordered in ED Medications - No data to display  ED Course  I have reviewed the triage vital signs and the nursing notes.  Pertinent labs & imaging results that were available during my care of the patient were reviewed by me and considered in my medical decision making (see chart for details).    MDM Rules/Calculators/A&P                           Patient vitals are stable, not in any acute distress.  Physical exam reassuring, no murmurs or adventitious lung sounds.  Patient seems to be having anginal symptoms, will check chest pain labs.  EKG shows sinus bradycardia, no ST elevations or depressions. HEART score 5.   Unstable angina symptoms with an elevated troponin of 41.  Second troponin still pending, put in consult to Dr. Doylene Canard.  Dr. Doylene Canard will see the patient and admit.  Final Clinical Impression(s) / ED Diagnoses Final diagnoses:  None    Rx / DC Orders ED Discharge Orders     None        Sherrill Raring, PA-C 01/03/21 0810    Sherrill Raring, PA-C 01/03/21 6720    Gareth Morgan, MD 01/03/21 2225

## 2021-01-03 NOTE — ED Notes (Signed)
Pt eating lunch at this time. Family at bedside.

## 2021-01-03 NOTE — ED Notes (Signed)
Patient to go to xray then room

## 2021-01-03 NOTE — H&P (Signed)
Referring Physician: Dixie Dials, MD  Nicholas Carroll is an 70 y.o. male.                       Chief Complaint: Chest pain  HPI: 70 years old black male with known h/o CAD, HTN, HLD and Obesity has recurrent chest pain radiating across the chest for 1 week. Chest pain is aggravated with exertion and cough. Chest x-ray is unremarkable. EKG is sinus bradycardia otherwise normal.   Past Medical History:  Diagnosis Date   Chest pain 06/2018   Coronary artery disease    Hyperlipidemia    Hypertension    Obesity    Sexual dysfunction       Past Surgical History:  Procedure Laterality Date   CARDIAC CATHETERIZATION N/A 12/23/2014   Procedure: Left Heart Cath and Coronary Angiography;  Surgeon: Dixie Dials, MD;  Location: [facility];  Service: Cardiovascular;  Laterality: N/A;   CORONARY STENT INTERVENTION     KNEE SURGERY      Family History  Problem Relation Age of Onset   Other Other        Pacemaker   Social History:  reports that he has quit smoking. He has never used smokeless tobacco. He reports that he does not drink alcohol and does not use drugs.  Allergies: No Known Allergies  (Not in a hospital admission)   Results for orders placed or performed during the hospital encounter of 01/03/21 (from the past 48 hour(s))  Basic metabolic panel     Status: Abnormal   Collection Time: 01/03/21  6:32 AM  Result Value Ref Range   Sodium 137 135 - 145 mmol/L   Potassium 3.5 3.5 - 5.1 mmol/L   Chloride 104 98 - 111 mmol/L   CO2 28 22 - 32 mmol/L   Glucose, Bld 114 (H) 70 - 99 mg/dL    Comment: Glucose reference range applies only to samples taken after fasting for at least 8 hours.   BUN 9 8 - 23 mg/dL   Creatinine, Ser 1.07 0.61 - 1.24 mg/dL   Calcium 9.2 8.9 - 10.3 mg/dL   GFR, Estimated >60 >60 mL/min    Comment: (NOTE) Calculated using the CKD-EPI Creatinine Equation (2021)    Anion gap 5 5 - 15    Comment: Performed at [facility] [address], [place] [address]  CBC     Status: None   Collection Time: 01/03/21  6:32 AM  Result Value Ref Range   WBC 4.1 4.0 - 10.5 K/uL   RBC 4.67 4.22 - 5.81 MIL/uL   Hemoglobin 13.9 13.0 - 17.0 g/dL   HCT 41.2 39.0 - 52.0 %   MCV 88.2 80.0 - 100.0 fL   MCH 29.8 26.0 - 34.0 pg   MCHC 33.7 30.0 - 36.0 g/dL   RDW 14.4 11.5 - 15.5 %   Platelets 209 150 - 400 K/uL   nRBC 0.0 0.0 - 0.2 %    Comment: Performed at [facility], [place] [address] [place], [place] [address]  Troponin I (High Sensitivity)     Status: Abnormal   Collection Time: 01/03/21  6:32 AM  Result Value Ref Range   Troponin I (High Sensitivity) 41 (H) <18 ng/L    Comment: (NOTE) Elevated high sensitivity troponin I (hsTnI) values and significant  changes across serial measurements may suggest ACS but many other  chronic and acute conditions are known to  elevate hsTnI results.  Refer to the "Links" section for chest pain algorithms and additional  guidance. Performed at [facility], [place] [address], [place], [place] [address]    DG Chest 2 View  Result Date: 01/03/2021 CLINICAL DATA:  70 year old male with history of chest pain. EXAM: CHEST - 2 VIEW COMPARISON:  Chest x-ray 06/27/2018. FINDINGS: Lung volumes are normal. No consolidative airspace disease. No pleural effusions. No pneumothorax. No pulmonary nodule or mass noted. Pulmonary vasculature and the cardiomediastinal silhouette are within normal limits. IMPRESSION: No radiographic evidence of acute cardiopulmonary disease. Electronically Signed   By: Vinnie Langton M.D.   On: 01/03/2021 06:56    Review Of Systems Constitutional: No fever, chills, weight loss or gain. Eyes: No vision change, wears glasses. No discharge or pain. Ears: No hearing loss, No tinnitus. Respiratory: No asthma, COPD, pneumonias. Positive shortness of breath. No hemoptysis. Cardiovascular: Positive chest pain, no palpitation, leg edema. Gastrointestinal: No nausea,  vomiting, diarrhea, constipation. No GI bleed. No hepatitis. Genitourinary: No dysuria, hematuria, kidney stone. No incontinance. Neurological: No headache, stroke, seizures.  Psychiatry: No psych facility admission for anxiety, depression, suicide. No detox. Skin: No rash. Musculoskeletal: Positive joint pain, fibromyalgia. No neck pain, back pain. Lymphadenopathy: No lymphadenopathy. Hematology: No anemia or easy bruising.   Blood pressure 122/77, pulse (!) 48, temperature 98.2 F (36.8 C), temperature source Oral, resp. rate 13, height 6\' 3"  (1.905 m), weight 113.4 kg, SpO2 97 %. Body mass index is 31.25 kg/m. General appearance: alert, cooperative, appears stated age and mild distress Head: Normocephalic, atraumatic. Eyes: Brown eyes, pink conjunctiva, corneas clear.  Neck: No adenopathy, no carotid bruit, no JVD, supple, symmetrical, trachea midline and thyroid not enlarged. Resp: Clear to auscultation bilaterally. Cardio: Regular rate and rhythm, S1, S2 normal, II/VI systolic murmur, no click, rub or gallop GI: Soft, non-tender; bowel sounds normal; no organomegaly. Extremities: Trace edema, no cyanosis or clubbing. Skin: Warm and dry.  Neurologic: Alert and oriented X 3, normal strength. Normal coordination.  Assessment/Plan Acute coronary syndrome CAD HTN HLD Obesity  Plan:  Admit. IV heparin. Home medications. Nuclear stress test v/s cardiac catheterization.  Time spent: Review of old records, Lab, x-rays, EKG, other cardiac tests, examination, discussion with patient/Wife/ER Doctor over 70 minutes.  Birdie Riddle, MD  01/03/2021, 9:04 AM

## 2021-01-04 ENCOUNTER — Encounter (HOSPITAL_COMMUNITY): Payer: Self-pay | Admitting: Cardiovascular Disease

## 2021-01-04 ENCOUNTER — Encounter (HOSPITAL_COMMUNITY)
Admission: EM | Disposition: A | Discharge status: Home / Self Care | Payer: Self-pay | Source: Home / Self Care | Attending: Cardiovascular Disease

## 2021-01-04 ENCOUNTER — Inpatient Hospital Stay (HOSPITAL_COMMUNITY): Payer: Medicare PPO

## 2021-01-04 HISTORY — PX: LEFT HEART CATH AND CORONARY ANGIOGRAPHY: CATH118249

## 2021-01-04 LAB — CBC
HCT: 36.9 % — ABNORMAL LOW (ref 39.0–52.0)
Hemoglobin: 12.3 g/dL — ABNORMAL LOW (ref 13.0–17.0)
MCH: 29.4 pg (ref 26.0–34.0)
MCHC: 33.3 g/dL (ref 30.0–36.0)
MCV: 88.3 fL (ref 80.0–100.0)
Platelets: 195 10*3/uL (ref 150–400)
RBC: 4.18 MIL/uL — ABNORMAL LOW (ref 4.22–5.81)
RDW: 14.4 % (ref 11.5–15.5)
WBC: 4.5 10*3/uL (ref 4.0–10.5)
nRBC: 0 % (ref 0.0–0.2)

## 2021-01-04 LAB — BASIC METABOLIC PANEL
Anion gap: 4 — ABNORMAL LOW (ref 5–15)
BUN: 8 mg/dL (ref 8–23)
CO2: 27 mmol/L (ref 22–32)
Calcium: 8.8 mg/dL — ABNORMAL LOW (ref 8.9–10.3)
Chloride: 107 mmol/L (ref 98–111)
Creatinine, Ser: 1.01 mg/dL (ref 0.61–1.24)
GFR, Estimated: >60 mL/min (ref >60)
Glucose, Bld: 128 mg/dL — ABNORMAL HIGH (ref 70–99)
Potassium: 3.5 mmol/L (ref 3.5–5.1)
Sodium: 138 mmol/L (ref 135–145)

## 2021-01-04 LAB — LIPID PANEL
Cholesterol: 114 mg/dL (ref 0–200)
HDL: 37 mg/dL — ABNORMAL LOW (ref >40)
LDL Cholesterol: 62 mg/dL (ref 0–99)
Total CHOL/HDL Ratio: 3.1 RATIO
Triglycerides: 76 mg/dL (ref <150)
VLDL: 15 mg/dL (ref 0–40)

## 2021-01-04 LAB — ECHOCARDIOGRAM COMPLETE
Area-P 1/2: 3.2 cm2
Height: 75 in
S' Lateral: 3.2 cm
Weight: 3611.2 oz

## 2021-01-04 LAB — HEPARIN LEVEL (UNFRACTIONATED): Heparin Unfractionated: 0.51 IU/mL (ref 0.30–0.70)

## 2021-01-04 LAB — GLUCOSE, CAPILLARY: Glucose-Capillary: 135 mg/dL — ABNORMAL HIGH (ref 70–99)

## 2021-01-04 SURGERY — LEFT HEART CATH AND CORONARY ANGIOGRAPHY
Anesthesia: LOCAL

## 2021-01-04 MED ORDER — MIDAZOLAM HCL 2 MG/2ML IJ SOLN
INTRAMUSCULAR | Status: DC | PRN
  Administered 2021-01-04: 1 mg via INTRAVENOUS

## 2021-01-04 MED ORDER — SODIUM CHLORIDE 0.9 % IV SOLN: 250.0000 mL | INTRAVENOUS | Status: DC | PRN

## 2021-01-04 MED ORDER — LIDOCAINE HCL (PF) 1 % IJ SOLN
INTRAMUSCULAR | Status: AC
  Filled 2021-01-04: qty 30

## 2021-01-04 MED ORDER — ISOSORBIDE MONONITRATE ER 30 MG PO TB24
15.0000 mg | ORAL_TABLET | Freq: Every day | ORAL | Status: DC
  Administered 2021-01-04: 15 mg via ORAL
  Filled 2021-01-04: qty 1

## 2021-01-04 MED ORDER — ISOSORBIDE MONONITRATE ER 30 MG PO TB24
30.0000 mg | ORAL_TABLET | Freq: Every day | ORAL | Status: DC
  Administered 2021-01-05 – 2021-01-06 (×2): 30 mg via ORAL
  Filled 2021-01-04 (×2): qty 1

## 2021-01-04 MED ORDER — VERAPAMIL HCL 2.5 MG/ML IV SOLN
INTRAVENOUS | Status: DC | PRN
  Administered 2021-01-04: 10 mL via INTRA_ARTERIAL

## 2021-01-04 MED ORDER — HEPARIN (PORCINE) 25000 UT/250ML-% IV SOLN
1500.0000 [IU]/h | INTRAVENOUS | Status: DC
  Administered 2021-01-04: 1400 [IU]/h via INTRAVENOUS
  Administered 2021-01-05: 1600 [IU]/h via INTRAVENOUS
  Filled 2021-01-04 (×2): qty 250

## 2021-01-04 MED ORDER — VERAPAMIL HCL 2.5 MG/ML IV SOLN
INTRAVENOUS | Status: AC
  Filled 2021-01-04: qty 2

## 2021-01-04 MED ORDER — LABETALOL HCL 5 MG/ML IV SOLN: 10.0000 mg | INTRAVENOUS | Status: AC | PRN

## 2021-01-04 MED ORDER — FENTANYL CITRATE (PF) 100 MCG/2ML IJ SOLN
INTRAMUSCULAR | Status: DC | PRN
  Administered 2021-01-04: 25 ug via INTRAVENOUS

## 2021-01-04 MED ORDER — HEPARIN SODIUM (PORCINE) 1000 UNIT/ML IJ SOLN
INTRAMUSCULAR | Status: AC
  Filled 2021-01-04: qty 1

## 2021-01-04 MED ORDER — HEPARIN (PORCINE) IN NACL 1000-0.9 UT/500ML-% IV SOLN
INTRAVENOUS | Status: AC
  Filled 2021-01-04: qty 1000

## 2021-01-04 MED ORDER — IOHEXOL 350 MG/ML SOLN
INTRAVENOUS | Status: DC | PRN
  Administered 2021-01-04: 45 mL via INTRA_ARTERIAL

## 2021-01-04 MED ORDER — HEPARIN (PORCINE) IN NACL 1000-0.9 UT/500ML-% IV SOLN
INTRAVENOUS | Status: DC | PRN
  Administered 2021-01-04 (×2): 500 mL

## 2021-01-04 MED ORDER — LISINOPRIL 10 MG PO TABS
10.0000 mg | ORAL_TABLET | Freq: Every day | ORAL | Status: DC
  Administered 2021-01-04 – 2021-01-06 (×3): 10 mg via ORAL
  Filled 2021-01-04 (×3): qty 1

## 2021-01-04 MED ORDER — HEPARIN SODIUM (PORCINE) 1000 UNIT/ML IJ SOLN
INTRAMUSCULAR | Status: DC | PRN
  Administered 2021-01-04: 6000 [IU] via INTRAVENOUS

## 2021-01-04 MED ORDER — SODIUM CHLORIDE 0.9 % IV SOLN: INTRAVENOUS | Status: AC

## 2021-01-04 MED ORDER — CARVEDILOL 3.125 MG PO TABS
3.1250 mg | ORAL_TABLET | Freq: Two times a day (BID) | ORAL | Status: DC
  Administered 2021-01-04 (×2): 3.125 mg via ORAL
  Filled 2021-01-04 (×2): qty 1

## 2021-01-04 MED ORDER — AMLODIPINE BESYLATE 5 MG PO TABS
5.0000 mg | ORAL_TABLET | Freq: Every day | ORAL | Status: DC
  Administered 2021-01-04 – 2021-01-06 (×3): 5 mg via ORAL
  Filled 2021-01-04 (×3): qty 1

## 2021-01-04 MED ORDER — FENTANYL CITRATE (PF) 100 MCG/2ML IJ SOLN
INTRAMUSCULAR | Status: AC
  Filled 2021-01-04: qty 2

## 2021-01-04 MED ORDER — HYDRALAZINE HCL 20 MG/ML IJ SOLN: 10.0000 mg | INTRAMUSCULAR | Status: AC | PRN

## 2021-01-04 MED ORDER — SODIUM CHLORIDE 0.9% FLUSH: 3.0000 mL | INTRAVENOUS | Status: DC | PRN

## 2021-01-04 MED ORDER — LIDOCAINE HCL (PF) 1 % IJ SOLN
INTRAMUSCULAR | Status: DC | PRN
  Administered 2021-01-04: 2 mL via SUBCUTANEOUS

## 2021-01-04 MED ORDER — SODIUM CHLORIDE 0.9% FLUSH
3.0000 mL | Freq: Two times a day (BID) | INTRAVENOUS | Status: DC
  Administered 2021-01-05: 3 mL via INTRAVENOUS

## 2021-01-04 MED ORDER — MIDAZOLAM HCL 2 MG/2ML IJ SOLN
INTRAMUSCULAR | Status: AC
  Filled 2021-01-04: qty 2

## 2021-01-04 SURGICAL SUPPLY — 10 items
CATH 5FR JL3.5 JR4 ANG PIG MP (CATHETERS) ×1 IMPLANT
DEVICE RAD COMP TR BAND LRG (VASCULAR PRODUCTS) ×1 IMPLANT
GLIDESHEATH SLEND SS 6F .021 (SHEATH) ×1 IMPLANT
GUIDEWIRE INQWIRE 1.5J.035X260 (WIRE) IMPLANT
INQWIRE 1.5J .035X260CM (WIRE) ×2
KIT HEART LEFT (KITS) ×2 IMPLANT
MAT PREVALON FULL STRYKER (MISCELLANEOUS) ×1 IMPLANT
PACK CARDIAC CATHETERIZATION (CUSTOM PROCEDURE TRAY) ×2 IMPLANT
SYR MEDRAD MARK 7 150ML (SYRINGE) ×2 IMPLANT
TRANSDUCER W/STOPCOCK (MISCELLANEOUS) ×2 IMPLANT

## 2021-01-04 NOTE — Interval H&P Note (Signed)
History and Physical Interval Note:  01/04/2021 9:58 AM  Nicholas Carroll  has presented today for surgery, with the diagnosis of ACS.  The various methods of treatment have been discussed with the patient and family. After consideration of risks, benefits and other options for treatment, the patient has consented to  Procedure(s): LEFT HEART CATH AND CORONARY ANGIOGRAPHY (N/A) as a surgical intervention.  The patient's history has been reviewed, patient examined, no change in status, stable for surgery.  I have reviewed the patient's chart and labs.  Questions were answered to the patient's satisfaction.     Birdie Riddle

## 2021-01-04 NOTE — Progress Notes (Addendum)
ANTICOAGULATION CONSULT NOTE - Initial Consult  Pharmacy Consult for heparin Indication: chest pain/ACS  No Known Allergies  Patient Measurements: Height: 6\' 3"  (190.5 cm) Weight: 102.4 kg (225 lb 11.2 oz) IBW/kg (Calculated) : 84.5 Heparin Dosing Weight: 108 kg  Vital Signs: Temp: 98.3 F (36.8 C) (09/29 0746) Temp Source: Oral (09/29 0535) BP: 137/92 (09/29 0746) Pulse Rate: 54 (09/29 0746)  Labs: Recent Labs    01/03/21 0981 01/03/21 0820 01/03/21 1152 01/03/21 1608 01/04/21 0230  HGB 13.9  --   --   --  12.3*  HCT 41.2  --   --   --  36.9*  PLT 209  --   --   --  195  APTT  --   --  87*  --   --   LABPROT  --   --  13.9  --   --   INR  --   --  1.1  --   --   HEPARINUNFRC  --   --   --  0.56 0.51  CREATININE 1.07  --   --   --  1.01  TROPONINIHS 41* 40*  --   --   --      Estimated Creatinine Clearance: 89.5 mL/min (by C-G formula based on SCr of 1.01 mg/dL).   Medical History: Past Medical History:  Diagnosis Date   Chest pain 06/2018   Coronary artery disease    Hyperlipidemia    Hypertension    Obesity    Sexual dysfunction      Assessment:  70 years old male with known h/o CAD, HTN, HLD and Obesity has recurrent chest pain radiating across the chest for 1 week. Heparin consult for ACS/NSTEMI.  Plans noted for nuclear stress test or cardiac catheterization -heparin level at goal on 1400 units/hr   Goal of Therapy:  Heparin level 0.3-0.7 units/ml Monitor platelets by anticoagulation protocol: Yes   Plan: -Continue heparin at 1400 units/hr -Daily heparin level and CBC  Hildred Laser, PharmD Clinical Pharmacist **Pharmacist phone directory can now be found on [email or web address] ([email or web address]).  Listed under [place].  Addendum -now s/p cath w/ multivessel CAD. Heparin to restart 8 hours after sheath removal (removed ~ 10:46am)  Plan -resume heparin 1400 units/hr at 7pm -Daily heparin level and CBC  Hildred Laser, PharmD Clinical  Pharmacist **Pharmacist phone directory can now be found on [email or web address] ([email or web address]).  Listed under [place].

## 2021-01-05 ENCOUNTER — Inpatient Hospital Stay (HOSPITAL_COMMUNITY): Payer: Medicare PPO

## 2021-01-05 LAB — BASIC METABOLIC PANEL
Anion gap: 6 (ref 5–15)
BUN: 10 mg/dL (ref 8–23)
CO2: 24 mmol/L (ref 22–32)
Calcium: 8.8 mg/dL — ABNORMAL LOW (ref 8.9–10.3)
Chloride: 107 mmol/L (ref 98–111)
Creatinine, Ser: 1.09 mg/dL (ref 0.61–1.24)
GFR, Estimated: >60 mL/min (ref >60)
Glucose, Bld: 117 mg/dL — ABNORMAL HIGH (ref 70–99)
Potassium: 3.8 mmol/L (ref 3.5–5.1)
Sodium: 137 mmol/L (ref 135–145)

## 2021-01-05 LAB — CBC
HCT: 37.7 % — ABNORMAL LOW (ref 39.0–52.0)
Hemoglobin: 12.6 g/dL — ABNORMAL LOW (ref 13.0–17.0)
MCH: 29.6 pg (ref 26.0–34.0)
MCHC: 33.4 g/dL (ref 30.0–36.0)
MCV: 88.7 fL (ref 80.0–100.0)
Platelets: 202 10*3/uL (ref 150–400)
RBC: 4.25 MIL/uL (ref 4.22–5.81)
RDW: 14.3 % (ref 11.5–15.5)
WBC: 4.9 10*3/uL (ref 4.0–10.5)
nRBC: 0 % (ref 0.0–0.2)

## 2021-01-05 LAB — HEPARIN LEVEL (UNFRACTIONATED)
Heparin Unfractionated: 0.24 IU/mL — ABNORMAL LOW (ref 0.30–0.70)
Heparin Unfractionated: 0.72 IU/mL — ABNORMAL HIGH (ref 0.30–0.70)

## 2021-01-05 LAB — HEMOGLOBIN A1C
Hgb A1c MFr Bld: 6.2 % — ABNORMAL HIGH (ref 4.8–5.6)
Mean Plasma Glucose: 131.24 mg/dL

## 2021-01-05 MED ORDER — TECHNETIUM TC 99M TETROFOSMIN IV KIT
11.0000 | PACK | Freq: Once | INTRAVENOUS | Status: AC | PRN
  Administered 2021-01-05: 11 via INTRAVENOUS

## 2021-01-05 MED ORDER — REGADENOSON 0.4 MG/5ML IV SOLN
0.4000 mg | Freq: Once | INTRAVENOUS | Status: AC
  Filled 2021-01-05: qty 5

## 2021-01-05 MED ORDER — REGADENOSON 0.4 MG/5ML IV SOLN
INTRAVENOUS | Status: AC
  Administered 2021-01-05: 0.4 mg via INTRAVENOUS
  Filled 2021-01-05: qty 5

## 2021-01-05 MED ORDER — TECHNETIUM TC 99M TETROFOSMIN IV KIT
30.5000 | PACK | Freq: Once | INTRAVENOUS | Status: AC | PRN
  Administered 2021-01-05: 30.5 via INTRAVENOUS

## 2021-01-05 NOTE — Progress Notes (Signed)
ANTICOAGULATION CONSULT NOTE  Pharmacy Consult for heparin Indication: chest pain/ACS  No Known Allergies  Patient Measurements: Height: 6\' 3"  (190.5 cm) Weight: 102.4 kg (225 lb 11.2 oz) IBW/kg (Calculated) : 84.5 Heparin Dosing Weight: 108 kg  Vital Signs: Temp: 97.7 F (36.5 C) (09/29 2008) Temp Source: Oral (09/29 2008) BP: 115/67 (09/29 2008) Pulse Rate: 54 (09/29 2008)  Labs: Recent Labs    01/03/21 3790 01/03/21 0820 01/03/21 1152 01/03/21 1608 01/04/21 0230 01/05/21 0216  HGB 13.9  --   --   --  12.3* 12.6*  HCT 41.2  --   --   --  36.9* 37.7*  PLT 209  --   --   --  195 202  APTT  --   --  87*  --   --   --   LABPROT  --   --  13.9  --   --   --   INR  --   --  1.1  --   --   --   HEPARINUNFRC  --   --   --  0.56 0.51 0.24*  CREATININE 1.07  --   --   --  1.01  --   TROPONINIHS 41* 40*  --   --   --   --      Estimated Creatinine Clearance: 89.5 mL/min (by C-G formula based on SCr of 1.01 mg/dL).   Medical History: Past Medical History:  Diagnosis Date   Chest pain 06/2018   Coronary artery disease    Hyperlipidemia    Hypertension    Obesity    Sexual dysfunction     Assessment:  70 years old male with known h/o CAD, HTN, HLD and Obesity has recurrent chest pain radiating across the chest for 1 week. S/p cath 9/29 - multivessel CAD. Heparin restarted post cath. Noted plan is to maximize medical therapy prior to considering CABG.   Heparin level down to subtherapeutic (0.24) on gtt at 1400 units/hr. No issues with line or bleeding reported per RN.  Goal of Therapy:  Heparin level 0.3-0.7 units/ml Monitor platelets by anticoagulation protocol: Yes   Plan: Increase heparin to 1600 units/hr Will f/u 8 hr heparin level  Sherlon Handing, PharmD, BCPS Please see amion for complete clinical pharmacist phone list 01/05/2021 3:33 AM

## 2021-01-05 NOTE — Progress Notes (Signed)
Spoke with Dr. Doylene Canard via phone; notified him that patient's HR has been low to mid 38s all day and that his morning coreg had been held for this reason; received verbal order to hold evening coreg as well.  Also received verbal order to discontinue patient's IV heparin.

## 2021-01-05 NOTE — Progress Notes (Signed)
Ref: Lin Landsman, MD   Subjective:  Occasional chest pain. Nuclear stress test is negative for reversible ischemia.  Objective:  Vital Signs in the last 24 hours: Temp:  [97.7 F (36.5 C)-98.3 F (36.8 C)] 98.3 F (36.8 C) (09/30 1343) Pulse Rate:  [48-84] 63 (09/30 1343) Cardiac Rhythm: Normal sinus rhythm;Sinus bradycardia (09/30 1144) Resp:  [18] 18 (09/30 1343) BP: (106-211)/(66-103) 106/66 (09/30 1343) SpO2:  [97 %-98 %] 97 % (09/30 1343) Weight:  [102 kg] 102 kg (09/30 0400)  Physical Exam: BP Readings from Last 1 Encounters:  01/05/21 106/66     Wt Readings from Last 1 Encounters:  01/05/21 102 kg    Weight change: -15.1 kg Body mass index is 28.11 kg/m. HEENT: [place]/AT, Eyes-Brown, onjunctiva-Pink, Sclera-Non-icteric Neck: No JVD, No bruit, Trachea midline. Lungs:  Clear, Bilateral. Cardiac:  Regular rhythm, normal S1 and S2, no S3. II/VI systolic murmur. Abdomen:  Soft, non-tender. BS present. Extremities:  No edema present. No cyanosis. No clubbing. CNS: AxOx3, Cranial nerves grossly intact, moves all 4 extremities.  Skin: Warm and dry.   Intake/Output from previous day: No intake/output data recorded.    Lab Results: BMET    Component Value Date/Time   NA 137 01/05/2021 0216   NA 138 01/04/2021 0230   NA 137 01/03/2021 0632   K 3.8 01/05/2021 0216   K 3.5 01/04/2021 0230   K 3.5 01/03/2021 0632   CL 107 01/05/2021 0216   CL 107 01/04/2021 0230   CL 104 01/03/2021 0632   CO2 24 01/05/2021 0216   CO2 27 01/04/2021 0230   CO2 28 01/03/2021 0632   GLUCOSE 117 (H) 01/05/2021 0216   GLUCOSE 128 (H) 01/04/2021 0230   GLUCOSE 114 (H) 01/03/2021 0632   BUN 10 01/05/2021 0216   BUN 8 01/04/2021 0230   BUN 9 01/03/2021 0632   CREATININE 1.09 01/05/2021 0216   CREATININE 1.01 01/04/2021 0230   CREATININE 1.07 01/03/2021 0632   CALCIUM 8.8 (L) 01/05/2021 0216   CALCIUM 8.8 (L) 01/04/2021 0230   CALCIUM 9.2 01/03/2021 0632   GFRNONAA >60 01/05/2021  0216   GFRNONAA >60 01/04/2021 0230   GFRNONAA >60 01/03/2021 0632   GFRAA >60 07/02/2018 0531   GFRAA >60 07/01/2018 1706   GFRAA >60 06/27/2018 1342   CBC    Component Value Date/Time   WBC 4.9 01/05/2021 0216   RBC 4.25 01/05/2021 0216   HGB 12.6 (L) 01/05/2021 0216   HCT 37.7 (L) 01/05/2021 0216   PLT 202 01/05/2021 0216   MCV 88.7 01/05/2021 0216   MCH 29.6 01/05/2021 0216   MCHC 33.4 01/05/2021 0216   RDW 14.3 01/05/2021 0216   LYMPHSABS 1.3 07/01/2018 1706   MONOABS 0.3 07/01/2018 1706   EOSABS 0.0 07/01/2018 1706   BASOSABS 0.0 07/01/2018 1706   HEPATIC Function Panel Recent Labs    01/03/21 0820  PROT 6.6   HEMOGLOBIN A1C No components found for: HGA1C,  MPG CARDIAC ENZYMES Lab Results  Component Value Date   CKTOTAL 181 07/30/2009   CKMB 2.0 07/30/2009   TROPONINI <0.03 07/02/2018   TROPONINI <0.03 07/02/2018   TROPONINI <0.03 07/02/2018   BNP No results for input(s): PROBNP in the last 8760 hours. TSH No results for input(s): TSH in the last 8760 hours. CHOLESTEROL Recent Labs    01/04/21 0230  CHOL 114    Scheduled Meds:  amLODipine  5 mg Oral Daily   aspirin EC  81 mg Oral Daily   atorvastatin  40 mg Oral q1800   carvedilol  3.125 mg Oral BID WC   influenza vaccine adjuvanted  0.5 mL Intramuscular Tomorrow-1000   isosorbide mononitrate  30 mg Oral Daily   lisinopril  10 mg Oral Daily   pneumococcal 23 valent vaccine  0.5 mL Intramuscular Tomorrow-1000   sodium chloride flush  3 mL Intravenous Q12H   sodium chloride flush  3 mL Intravenous Q12H   Continuous Infusions:  sodium chloride     sodium chloride 50 mL/hr at 01/03/21 0954   sodium chloride     heparin 1,500 Units/hr (01/05/21 1504)   PRN Meds:.sodium chloride, sodium chloride, acetaminophen, nitroGLYCERIN, ondansetron (ZOFRAN) IV, sodium chloride flush, sodium chloride flush  Assessment/Plan: Unstable angina CAD HTN HLD Obesity  Plan: Increase activity. Discharge home  in AM if stable.   LOS: 2 days   Time spent including chart review, lab review, examination, discussion with patient/wife/Nurse : 30 min   Dixie Dials  MD  01/05/2021, 5:24 PM

## 2021-01-05 NOTE — Progress Notes (Signed)
[facility] for heparin Indication: chest pain/ACS  No Known Allergies  Patient Measurements: Height: 6\' 3"  (190.5 cm) Weight: 102 kg (224 lb 13.9 oz) IBW/kg (Calculated) : 84.5 Heparin Dosing Weight: 108 kg  Vital Signs: Temp: 98 F (36.7 C) (09/30 0400) Temp Source: Oral (09/30 0400) BP: 126/68 (09/30 0400) Pulse Rate: 54 (09/30 0400)  Labs: Recent Labs    01/03/21 0632 01/03/21 0820 01/03/21 1152 01/03/21 1608 01/04/21 0230 01/05/21 0216  HGB 13.9  --   --   --  12.3* 12.6*  HCT 41.2  --   --   --  36.9* 37.7*  PLT 209  --   --   --  195 202  APTT  --   --  87*  --   --   --   LABPROT  --   --  13.9  --   --   --   INR  --   --  1.1  --   --   --   HEPARINUNFRC  --   --   --  0.56 0.51 0.24*  CREATININE 1.07  --   --   --  1.01 1.09  TROPONINIHS 41* 40*  --   --   --   --      Estimated Creatinine Clearance: 82.8 mL/min (by C-G formula based on SCr of 1.09 mg/dL).   Medical History: Past Medical History:  Diagnosis Date   Chest pain 06/2018   Coronary artery disease    Hyperlipidemia    Hypertension    Obesity    Sexual dysfunction     Assessment:  70 years old male with known h/o CAD, HTN, HLD and Obesity has recurrent chest pain radiating across the chest for 1 week. S/p cath 9/29 - multivessel CAD. Heparin restarted post cath. Noted plan is to maximize medical therapy prior to considering CABG.   Heparin level above goal (0.72) on gtt at 1600 units/hr. No issues with line or bleeding reported per RN.  Goal of Therapy:  Heparin level 0.3-0.7 units/ml Monitor platelets by anticoagulation protocol: Yes   Plan: Decrease heparin to 1500 units/hr Will f/u daily heparin level  Erin Hearing PharmD., BCPS Clinical Pharmacist 01/05/2021 9:10 AM

## 2021-01-06 LAB — CBC
HCT: 37.9 % — ABNORMAL LOW (ref 39.0–52.0)
Hemoglobin: 12.8 g/dL — ABNORMAL LOW (ref 13.0–17.0)
MCH: 29.6 pg (ref 26.0–34.0)
MCHC: 33.8 g/dL (ref 30.0–36.0)
MCV: 87.5 fL (ref 80.0–100.0)
Platelets: 190 10*3/uL (ref 150–400)
RBC: 4.33 MIL/uL (ref 4.22–5.81)
RDW: 14.3 % (ref 11.5–15.5)
WBC: 5.6 10*3/uL (ref 4.0–10.5)
nRBC: 0 % (ref 0.0–0.2)

## 2021-01-06 MED ORDER — ISOSORBIDE MONONITRATE ER 30 MG PO TB24: 30.0000 mg | ORAL_TABLET | Freq: Every day | ORAL | Status: DC

## 2021-01-06 NOTE — Discharge Summary (Signed)
Physician Discharge Summary  Patient ID: Nicholas Carroll MRN: [id number] DOB/AGE: 69-Jul-1952 69 y.o.  Admit date: 01/03/2021 Discharge date: 01/06/2021  Admission Diagnoses: Acute coronary syndrome CAD HTN HLD Obesity  Discharge Diagnoses:  Principle Problem:   Unstable angina Active problem:   CAD, multivessel, native vessel   Hypertension, essential   Hyperlipidemia   Obesity  Discharged Condition: fair  Hospital Course: 70 years old black male with known h/o CAD, HTN, HLD and obesity has recurrent chest pain across the chest for 1 week. His EKG showed sinus bradycardia. Chest x-ray was unremarkable. MI was ruled out. He underwent cardiac catherterization showing non-obstructive multivessel CAD including 40 % LM disease. He underwent nuclear stress test that failed to show reversible ischemia. His isosorbide dose was increased to 30 mg. daily from 15 mg. daily.  His Carvedilol was discontinue for sinus bradycardia. He ambulated without chest pain. He was discharged home in stable condition. He will see me in 1 week  Consults: cardiology  Significant Diagnostic Studies: labs: CBC, BMET, troponin I, Lipid panel and Hgb A1C are near normal.  EKG: sinus bradycardia.  Chest x-ray: unremarkable.  Echocardiogram: normal wall motion, LVEF 60-65 % and mild MR and TR.  Cardiac catheterization: LM eccentric 50 %, Proximal LAD 20 %, Mid LAD 45 %, RCA 20-35 % and LCx 60 % stenosis  Treatments: cardiac meds: lisinopril (generic), Isosorbide, amlodipine, and Atorvastatin. Betablocker held for bradycardia.  Discharge Exam: Blood pressure 133/89, pulse (!) 55, temperature 98.7 F (37.1 C), temperature source Oral, resp. rate 17, height 6\' 3"  (1.905 m), weight 117 kg, SpO2 98 %. General appearance: alert, cooperative and appears stated age. Head: Normocephalic, atraumatic. Eyes: Brown/Blue eyes, pink conjunctiva, corneas clear.   Neck: No adenopathy, no carotid bruit, no JVD,  supple, symmetrical, trachea midline and thyroid not enlarged. Resp: Clear to auscultation bilaterally. Cardio: Regular rate and rhythm, S1, S2 normal, II/VI systolic murmur, no click, rub or gallop. GI: Soft, non-tender; bowel sounds normal; no organomegaly. Extremities: No edema, cyanosis or clubbing. Skin: Warm and dry.  Neurologic: Alert and oriented X 3, normal strength and tone. Normal coordination and gait.  Disposition: Discharge disposition: 01-Home or Self Care        Allergies as of 01/06/2021   No Known Allergies      Medication List     STOP taking these medications    carvedilol 3.125 MG tablet Commonly known as: COREG   HYDROcodone-acetaminophen 5-325 MG tablet Commonly known as: NORCO/VICODIN       TAKE these medications    amLODipine 5 MG tablet Commonly known as: NORVASC Take 1 tablet (5 mg total) by mouth daily.   aspirin 81 MG tablet Take 81 mg by mouth daily.   atorvastatin 40 MG tablet Commonly known as: LIPITOR Take 1 tablet (40 mg total) by mouth daily at 6 PM.   isosorbide mononitrate 30 MG 24 hr tablet Commonly known as: IMDUR Take 1 tablet (30 mg total) by mouth daily. What changed: how much to take   lisinopril 10 MG tablet Commonly known as: ZESTRIL Take 1 tablet (10 mg total) by mouth daily.   nitroGLYCERIN 0.4 MG SL tablet Commonly known as: NITROSTAT Place 1 tablet (0.4 mg total) under the tongue every 5 (five) minutes x 3 doses as needed for chest pain.         Time spent: Review of old chart, current chart, lab, x-ray, cardiac tests and discussion with patient/Wife and Nurse over 60 minutes.  Signed:  Birdie Riddle 01/06/2021, 9:34 AM

## 2022-04-30 ENCOUNTER — Other Ambulatory Visit: Payer: Self-pay | Admitting: Urology

## 2022-04-30 DIAGNOSIS — C61 Malignant neoplasm of prostate: Secondary | ICD-10-CM

## 2022-05-20 ENCOUNTER — Observation Stay (HOSPITAL_COMMUNITY)
Admission: EM | Admit: 2022-05-20 | Discharge: 2022-05-22 | Disposition: A | Discharge status: Home / Self Care | Payer: Medicare PPO | Attending: Family Medicine | Admitting: Family Medicine

## 2022-05-20 ENCOUNTER — Other Ambulatory Visit: Payer: Self-pay

## 2022-05-20 ENCOUNTER — Encounter (HOSPITAL_COMMUNITY): Payer: Self-pay

## 2022-05-20 ENCOUNTER — Emergency Department (HOSPITAL_COMMUNITY): Payer: Medicare PPO

## 2022-05-20 DIAGNOSIS — M79661 Pain in right lower leg: Secondary | ICD-10-CM | POA: Diagnosis not present

## 2022-05-20 DIAGNOSIS — Z955 Presence of coronary angioplasty implant and graft: Secondary | ICD-10-CM | POA: Diagnosis not present

## 2022-05-20 DIAGNOSIS — I25110 Atherosclerotic heart disease of native coronary artery with unstable angina pectoris: Secondary | ICD-10-CM | POA: Diagnosis not present

## 2022-05-20 DIAGNOSIS — I209 Angina pectoris, unspecified: Secondary | ICD-10-CM | POA: Diagnosis present

## 2022-05-20 DIAGNOSIS — Z79899 Other long term (current) drug therapy: Secondary | ICD-10-CM | POA: Insufficient documentation

## 2022-05-20 DIAGNOSIS — E7800 Pure hypercholesterolemia, unspecified: Secondary | ICD-10-CM | POA: Diagnosis not present

## 2022-05-20 DIAGNOSIS — K625 Hemorrhage of anus and rectum: Secondary | ICD-10-CM

## 2022-05-20 DIAGNOSIS — Z23 Encounter for immunization: Secondary | ICD-10-CM | POA: Insufficient documentation

## 2022-05-20 DIAGNOSIS — Z7982 Long term (current) use of aspirin: Secondary | ICD-10-CM | POA: Insufficient documentation

## 2022-05-20 DIAGNOSIS — Z87891 Personal history of nicotine dependence: Secondary | ICD-10-CM | POA: Diagnosis not present

## 2022-05-20 DIAGNOSIS — I10 Essential (primary) hypertension: Secondary | ICD-10-CM | POA: Diagnosis present

## 2022-05-20 DIAGNOSIS — R079 Chest pain, unspecified: Secondary | ICD-10-CM | POA: Diagnosis present

## 2022-05-20 DIAGNOSIS — R319 Hematuria, unspecified: Secondary | ICD-10-CM

## 2022-05-20 LAB — CBC
HCT: 37.2 % — ABNORMAL LOW (ref 39.0–52.0)
Hemoglobin: 13.1 g/dL (ref 13.0–17.0)
MCH: 30 pg (ref 26.0–34.0)
MCHC: 35.2 g/dL (ref 30.0–36.0)
MCV: 85.3 fL (ref 80.0–100.0)
Platelets: 203 10*3/uL (ref 150–400)
RBC: 4.36 MIL/uL (ref 4.22–5.81)
RDW: 14.7 % (ref 11.5–15.5)
WBC: 5.4 10*3/uL (ref 4.0–10.5)
nRBC: 0 % (ref 0.0–0.2)

## 2022-05-20 LAB — TROPONIN I (HIGH SENSITIVITY)
Troponin I (High Sensitivity): 44 ng/L — ABNORMAL HIGH (ref <18)
Troponin I (High Sensitivity): 48 ng/L — ABNORMAL HIGH (ref <18)

## 2022-05-20 LAB — COMPREHENSIVE METABOLIC PANEL
ALT: 27 U/L (ref 0–44)
AST: 34 U/L (ref 15–41)
Albumin: 3.5 g/dL (ref 3.5–5.0)
Alkaline Phosphatase: 65 U/L (ref 38–126)
Anion gap: 9 (ref 5–15)
BUN: 12 mg/dL (ref 8–23)
CO2: 22 mmol/L (ref 22–32)
Calcium: 9.1 mg/dL (ref 8.9–10.3)
Chloride: 104 mmol/L (ref 98–111)
Creatinine, Ser: 0.94 mg/dL (ref 0.61–1.24)
GFR, Estimated: >60 mL/min (ref >60)
Glucose, Bld: 82 mg/dL (ref 70–99)
Potassium: 3.9 mmol/L (ref 3.5–5.1)
Sodium: 135 mmol/L (ref 135–145)
Total Bilirubin: 1.6 mg/dL — ABNORMAL HIGH (ref 0.3–1.2)
Total Protein: 6.6 g/dL (ref 6.5–8.1)

## 2022-05-20 LAB — BRAIN NATRIURETIC PEPTIDE: B Natriuretic Peptide: 18.5 pg/mL (ref 0.0–100.0)

## 2022-05-20 MED ORDER — ASPIRIN 81 MG PO TBEC
81.0000 mg | DELAYED_RELEASE_TABLET | Freq: Every day | ORAL | Status: DC
  Administered 2022-05-20 – 2022-05-22 (×2): 81 mg via ORAL
  Filled 2022-05-20 (×2): qty 1

## 2022-05-20 MED ORDER — ATORVASTATIN CALCIUM 80 MG PO TABS
80.0000 mg | ORAL_TABLET | Freq: Every day | ORAL | Status: DC
  Administered 2022-05-20 – 2022-05-22 (×3): 80 mg via ORAL
  Filled 2022-05-20 (×2): qty 1
  Filled 2022-05-20: qty 2

## 2022-05-20 MED ORDER — METOPROLOL TARTRATE 12.5 MG HALF TABLET
12.5000 mg | ORAL_TABLET | Freq: Two times a day (BID) | ORAL | Status: DC
  Administered 2022-05-21 – 2022-05-22 (×2): 12.5 mg via ORAL
  Filled 2022-05-20 (×2): qty 1

## 2022-05-20 MED ORDER — HEPARIN BOLUS VIA INFUSION
4000.0000 [IU] | Freq: Once | INTRAVENOUS | Status: AC
  Administered 2022-05-20: 4000 [IU] via INTRAVENOUS
  Filled 2022-05-20: qty 4000

## 2022-05-20 MED ORDER — MORPHINE SULFATE (PF) 2 MG/ML IV SOLN
2.0000 mg | Freq: Once | INTRAVENOUS | Status: AC
  Administered 2022-05-20: 2 mg via INTRAVENOUS
  Filled 2022-05-20: qty 1

## 2022-05-20 MED ORDER — NITROGLYCERIN 2 % TD OINT
0.5000 [in_us] | TOPICAL_OINTMENT | Freq: Once | TRANSDERMAL | Status: AC
  Administered 2022-05-20: 0.5 [in_us] via TOPICAL
  Filled 2022-05-20: qty 1

## 2022-05-20 MED ORDER — HEPARIN (PORCINE) 25000 UT/250ML-% IV SOLN
1500.0000 [IU]/h | INTRAVENOUS | Status: DC
  Administered 2022-05-20: 1300 [IU]/h via INTRAVENOUS
  Administered 2022-05-21: 1500 [IU]/h via INTRAVENOUS
  Filled 2022-05-20 (×2): qty 250

## 2022-05-20 MED ORDER — AMLODIPINE BESYLATE 5 MG PO TABS
5.0000 mg | ORAL_TABLET | Freq: Every day | ORAL | Status: DC
  Administered 2022-05-20 – 2022-05-22 (×3): 5 mg via ORAL
  Filled 2022-05-20 (×3): qty 1

## 2022-05-20 NOTE — Assessment & Plan Note (Addendum)
Denies chest pain today. S/p cath 2/13 - Cardiology signed off - continue metoprolol - Continue home atorvastatin, aspirin  - Cardiac telemetry  - Vitals per floor  - PT eval and treat

## 2022-05-20 NOTE — ED Triage Notes (Signed)
Pt c/o non radiating chest pain across entire chest intx1wk. Pt denies N/V. Pt c/o SOB, HA

## 2022-05-20 NOTE — ED Provider Triage Note (Signed)
Emergency Medicine Provider Triage Evaluation Note  Nicholas Carroll , a 72 y.o. male  was evaluated in triage.  Pt complains of intermittent nonradiating chest discomfort over the last week.  Usually accompanied with shortness of breath.  Occasionally worse with exertion.  Takes baby aspirin daily.  Denies active chest pain or discomfort.  Denies nausea, vomiting, fevers, leg weakness, or recent URI symptoms.  Hx of stent 2016, patient denies history of prior MI.  Hx of HTN, CAD, ACS, hyperlipidemia  Review of Systems  Positive:  Negative: See above  Physical Exam  BP 126/71   Pulse 63   Temp 98.7 F (37.1 C) (Oral)   Resp 20   Ht 6' 3"$  (1.905 m)   Wt 117 kg   SpO2 99%   BMI 32.24 kg/m  Gen:   Awake, no distress   Resp:  Normal effort  MSK:   Moves extremities without difficulty  Other:  Sitting comfortably.  Chest non-TTP, no crepitus.  No cardiac murmur.  Lungs CTAB.  Not diaphoretic.  Medical Decision Making  Medically screening exam initiated at 12:12 PM.  Appropriate orders placed.  Nicholas Carroll was informed that the remainder of the evaluation will be completed by another provider, this initial triage assessment does not replace that evaluation, and the importance of remaining in the ED until their evaluation is complete.  Discussed with triage nurse, patient to be brought back to the next available room soon as possible.   [name], PA-C [id number]

## 2022-05-20 NOTE — Assessment & Plan Note (Signed)
On amlodipine 5 mg daily and metoprolol 12.5 BID  - continue amlodipine  - Hold metoprolol for HR < 60, or SBP < 100

## 2022-05-20 NOTE — Consult Note (Signed)
Reason for Consult: Recurrent chest pain Referring Physician: [facility] hospitalist  Nicholas Carroll is an 72 y.o. male.  HPI: Patient is a 72 year old male with past medical history significant for coronary artery disease status post PTCA stenting to left main in April 2011 he had Promus 4.0 x 15 mm long drug-eluting stent, hypertension, hyperlipidemia, morbid obesity, erectile dysfunction, remote history of tobacco abuse came to ER complaining of recurrent retrosternal chest pain off and on for last 1 week states chest pain last anywhere between 5 to 8 minutes graded 8/10 associated with nausea denies any diaphoresis denies shortness of breath denies palpitation lightheadedness or syncope states has not used any nitro also noncompliant to medications including aspirin.  EKG showed normal sinus rhythm with no acute ischemic changes however high-sensitivity troponin very minimally elevated.  Past Medical History:  Diagnosis Date   Chest pain 06/2018   Coronary artery disease    Hyperlipidemia    Hypertension    Obesity    Sexual dysfunction     Past Surgical History:  Procedure Laterality Date   CARDIAC CATHETERIZATION N/A 12/23/2014   Procedure: Left Heart Cath and Coronary Angiography;  Surgeon: Dixie Dials, MD;  Location: [facility];  Service: Cardiovascular;  Laterality: N/A;   CORONARY STENT INTERVENTION     KNEE SURGERY     LEFT HEART CATH AND CORONARY ANGIOGRAPHY N/A 01/04/2021   Procedure: LEFT HEART CATH AND CORONARY ANGIOGRAPHY;  Surgeon: Dixie Dials, MD;  Location: [facility];  Service: Cardiovascular;  Laterality: N/A;    Family History  Problem Relation Age of Onset   Other Other        Pacemaker    Social History:  reports that he has quit smoking. His smoking use included cigarettes. He has never used smokeless tobacco. He reports that he does not drink alcohol and does not use drugs.  Allergies: No Known Allergies  Medications: I have reviewed the  patient's current medications.  Results for orders placed or performed during the hospital encounter of 05/20/22 (from the past 48 hour(s))  Comprehensive metabolic panel     Status: Abnormal   Collection Time: 05/20/22 12:59 PM  Result Value Ref Range   Sodium 135 135 - 145 mmol/L   Potassium 3.9 3.5 - 5.1 mmol/L    Comment: HEMOLYSIS AT THIS LEVEL MAY AFFECT RESULT   Chloride 104 98 - 111 mmol/L   CO2 22 22 - 32 mmol/L   Glucose, Bld 82 70 - 99 mg/dL    Comment: Glucose reference range applies only to samples taken after fasting for at least 8 hours.   BUN 12 8 - 23 mg/dL   Creatinine, Ser 0.94 0.61 - 1.24 mg/dL   Calcium 9.1 8.9 - 10.3 mg/dL   Total Protein 6.6 6.5 - 8.1 g/dL   Albumin 3.5 3.5 - 5.0 g/dL   AST 34 15 - 41 U/L    Comment: HEMOLYSIS AT THIS LEVEL MAY AFFECT RESULT   ALT 27 0 - 44 U/L    Comment: HEMOLYSIS AT THIS LEVEL MAY AFFECT RESULT   Alkaline Phosphatase 65 38 - 126 U/L   Total Bilirubin 1.6 (H) 0.3 - 1.2 mg/dL    Comment: HEMOLYSIS AT THIS LEVEL MAY AFFECT RESULT   GFR, Estimated >60 >60 mL/min    Comment: (NOTE) Calculated using the CKD-EPI Creatinine Equation (2021)    Anion gap 9 5 - 15    Comment: Performed at [facility], [place]  [address], [place], [place] [address]  Troponin I (High Sensitivity)     Status: Abnormal   Collection Time: 05/20/22 12:59 PM  Result Value Ref Range   Troponin I (High Sensitivity) 44 (H) <18 ng/L    Comment: (NOTE) Elevated high sensitivity troponin I (hsTnI) values and significant  changes across serial measurements may suggest ACS but many other  chronic and acute conditions are known to elevate hsTnI results.  Refer to the "Links" section for chest pain algorithms and additional  guidance. Performed at [facility], [place] [address], [place], [place] [address]   Troponin I (High Sensitivity)     Status: Abnormal   Collection Time: 05/20/22  3:30 PM  Result Value Ref Range   Troponin I (High Sensitivity)  48 (H) <18 ng/L    Comment: (NOTE) Elevated high sensitivity troponin I (hsTnI) values and significant  changes across serial measurements may suggest ACS but many other  chronic and acute conditions are known to elevate hsTnI results.  Refer to the "Links" section for chest pain algorithms and additional  guidance. Performed at [facility], [place] [address], [place], [place] [address]   Brain natriuretic peptide     Status: None   Collection Time: 05/20/22  3:30 PM  Result Value Ref Range   B Natriuretic Peptide 18.5 0.0 - 100.0 pg/mL    Comment: Performed at [facility] [address], [place], [place] [address]  CBC     Status: Abnormal   Collection Time: 05/20/22  3:30 PM  Result Value Ref Range   WBC 5.4 4.0 - 10.5 K/uL   RBC 4.36 4.22 - 5.81 MIL/uL   Hemoglobin 13.1 13.0 - 17.0 g/dL   HCT 37.2 (L) 39.0 - 52.0 %   MCV 85.3 80.0 - 100.0 fL   MCH 30.0 26.0 - 34.0 pg   MCHC 35.2 30.0 - 36.0 g/dL   RDW 14.7 11.5 - 15.5 %   Platelets 203 150 - 400 K/uL   nRBC 0.0 0.0 - 0.2 %    Comment: Performed at [facility], [place] [address], [place], [place] [address]    DG Chest 2 View  Result Date: 05/20/2022 CLINICAL DATA:  Intermittent chest pain radiating to the left arm EXAM: CHEST - 2 VIEW COMPARISON:  05/05/2020 FINDINGS: Heart size upper limits of normal. Mildly tortuous aorta. Both lungs are clear. The visualized skeletal structures are unremarkable. IMPRESSION: No active cardiopulmonary disease. Electronically Signed   By: Nelson Chimes M.D.   On: 05/20/2022 14:52    Review of Systems  Constitutional:  Negative for fatigue and fever.  HENT:  Negative for sore throat.   Eyes:  Negative for discharge.  Respiratory:  Negative for cough and shortness of breath.   Cardiovascular:  Positive for chest pain and leg swelling. Negative for palpitations.  Gastrointestinal:  Negative for abdominal pain.  Genitourinary:  Negative for difficulty urinating.  Neurological:   Negative for dizziness and seizures.   Blood pressure 139/76, pulse 66, temperature 98.3 F (36.8 C), temperature source Oral, resp. rate 20, height 6' 3"$  (1.905 m), weight 117 kg, SpO2 97 %. Physical Exam Constitutional:      Appearance: He is well-developed.  HENT:     Head: Normocephalic and atraumatic.  Eyes:     Extraocular Movements: Extraocular movements intact.     Pupils: Pupils are equal, round, and reactive to light.  Neck:     Vascular: No JVD.  Cardiovascular:  Rate and Rhythm: Normal rate and regular rhythm.     Heart sounds: Murmur (2/6 systolic murmur noted no S3 gallop) heard.  Pulmonary:     Effort: Pulmonary effort is normal.     Breath sounds: Normal breath sounds. No wheezing, rhonchi or rales.  Musculoskeletal:     Cervical back: Normal range of motion and neck supple.     Comments: No clubbing cyanosis 1+ edema right more than left  Neurological:     General: No focal deficit present.     Mental Status: He is alert.     Assessment/Plan: Acute coronary syndrome rule out progression of CAD Coronary artery disease history of unprotected left main stenting in April 2011 Hypertensive heart disease Hyperlipidemia Morbid obesity Erectile dysfunction Remote tobacco abuse Plan Rule out MI protocol agree with IV heparin and nitrates Agree with aspirin, statin, beta-blockers and calcium channel blockers Discussed with patient and his wife at length various options of treatment and agrees for left cardiac catheterization possible PTCA stenting.  Will get interventional cardiology consult with Dr.Ganji to see the patient.  Charolette Forward 05/20/2022, 6:05 PM

## 2022-05-20 NOTE — H&P (Signed)
Hospital Admission History and Physical Service Pager: [phone]  Patient name: Nicholas Carroll Medical record number: [id number] Date of Birth: 04-Dec-1950 Age: 72 y.o. Gender: male  Primary Care Provider: Lin Landsman, MD Consultants: Cardiology Code Status: Full Preferred Emergency Contact: Deitrich Decleene (wife) [phone]  Chief Complaint: Chest pain  Assessment and Plan: ROMEY Carroll is a 72 y.o. male presenting with chest pain . Differential for this patient's presentation of this includes unstable angina, stable angina, new onset HF, GERD. Patient does not have an MI at this time as there are no ST changes on EKG and troponins have been stable. However, patient does have history of CAD with previous DES, and had 40% stenosis of LAD. Pain on rest and exertion is more aligned with unstable angina.   * Chest pain Patient seems to be having unstable angina with pain at rest and exertion. Has history of CAD with DES and 40% stenosis of LAD.  - Admit to FPTS, Attending Dr Dorcas Mcmurray  - Cardiology consulted, appreciate recs   - considering cath tomorrow, NPO at midnight  - Heparin gtt, dosing per pharmacy  - Morphine 2 mg once for pain  - Continue home atorvastatin, aspirin  - Hold metoprolol for low HR in 50s  - Cardiac telemetry  - Vitals per floor  - PT eval and treat   Essential hypertension On amlodipine 5 mg daily and metoprolol 12.5 BID  - continue amlodipine  - Hold metoprolol for HR < 60, or SBP < 100    FEN/GI: Heart healthy, npo at midnight  VTE Prophylaxis: On therapeutic heparin   Disposition: med-tele  History of Present Illness:  Nicholas Carroll is a 72 y.o. male presenting with chest pain.  Patient reports that he has been having chest pain for the past week.  Quality of chest pain has varied.  Patient reports that sometimes pain is squeezing, pressure, intense.  Patient says that it usually starts on the left side and radiates throughout his entire  anterior chest.  Occasionally associated with shortness of breath and/or diaphoresis.  Patient says that pain is present at rest, has woken him up from sleep and is also present with exertion.  Pain had been worsening over the last week but today was very intense thus came into the ED.  He has not tried his home nitroglycerin.  Denies nausea or vomiting. Says he has been taking his home medications as prescribed except for aspirin which he occasionally misses according to his wife.  In the ED, patient had stable vital signs.  EKG showed no significant changes from prior, no ST changes.  Troponins were 44 and 48.  Cardiology was consulted.  Cardiology agreed with initiating IV heparin and continuing home antihypertensive, statin, aspirin.  Cardiology plan for left cardiac catheterization and possible PTCA stenting.  Will involve interventional cardiology consult with Dr. Einar Gip. .  Review Of Systems: Per HPI with the following additions: Chest pain, diaphoresis  Pertinent Past Medical History: CAD HTN HLD Erectile dysfunction Remainder reviewed in history tab.   Pertinent Past Surgical History: Stents 2011 Remainder reviewed in history tab.   Pertinent Social History: Tobacco use: Former (quit in the 1970s) Alcohol use: None currently Other Substance use: Denies Lives with wife  Pertinent Family History: Remainder reviewed in history tab.   Important Outpatient Medications: ASA 57m Amlodipine 525mdaily Atorvastatin 4084maily Isosorbide mononitrate 5m22msinopril 10mg35mly Remainder reviewed in medication history.   Objective: BP 134/78  Pulse (!) 59   Temp 97.6 F (36.4 C) (Oral)   Resp 17   Ht 6' 3"$  (1.905 m)   Wt 117 kg   SpO2 97%   BMI 32.24 kg/m  Exam: General: Uncomfortable appearing, laying in bed in no acute distress ENTM: Moist mucous membranes Cardiovascular: Bradycardic to 50s, regular rhythm, unable to appreciate any murmurs rubs or gallops, radial  pulses palpable and equal, dorsal pedal pulses palpable and equal, cap refill less than 2 seconds Respiratory: No increased work of breathing, CTAB Gastrointestinal: Soft, nondistended, no tenderness to palpation MSK: No increased pain on palpation of chest wall Neuro: Alert and oriented EXT: Left extremity trace pitting edema to mid shin, right extremity 1+ pitting edema to mid shin with tenderness, no erythema  Labs:  CBC BMET  Recent Labs  Lab 05/20/22 1530  WBC 5.4  HGB 13.1  HCT 37.2*  PLT 203   Recent Labs  Lab 05/20/22 1259  NA 135  K 3.9  CL 104  CO2 22  BUN 12  CREATININE 0.94  GLUCOSE 82  CALCIUM 9.1    Pertinent additional labs troponin 44, 48 EKG: Bradycardia heart rate 58, possible irregular rhythm, no ST elevation or inverted T waves, QTc 449   Imaging Studies Performed:  Chest x-ray: Slightly larger heart, no active cardiopulmonary disease    Lowry Ram, MD 05/20/2022, 9:58 PM PGY-1, [place] Intern pager: [phone], text pages welcome Secure chat group [facility] Teaching Service

## 2022-05-20 NOTE — Progress Notes (Signed)
ANTICOAGULATION CONSULT NOTE - Initial Consult  Pharmacy Consult for heparin Indication: chest pain/ACS  No Known Allergies  Patient Measurements: Height: 6' 3"$  (190.5 cm) Weight: 117 kg (257 lb 15 oz) IBW/kg (Calculated) : 84.5 Heparin Dosing Weight: 109kg  Vital Signs: Temp: 98.3 F (36.8 C) (02/12 1530) Temp Source: Oral (02/12 1530) BP: 139/76 (02/12 1730) Pulse Rate: 66 (02/12 1730)  Labs: Recent Labs    05/20/22 1259 05/20/22 1530  HGB  --  13.1  HCT  --  37.2*  PLT  --  203  CREATININE 0.94  --   TROPONINIHS 44* 48*    Estimated Creatinine Clearance: 99.4 mL/min (by C-G formula based on SCr of 0.94 mg/dL).   Medical History: Past Medical History:  Diagnosis Date   Chest pain 06/2018   Coronary artery disease    Hyperlipidemia    Hypertension    Obesity    Sexual dysfunction      Assessment: 55 YOM presenting with recurrent CP, hx of CAD, he is not on anticoagulation PTA  Goal of Therapy:  Heparin level 0.3-0.7 units/ml Monitor platelets by anticoagulation protocol: Yes   Plan:  Heparin 4000 units IV x 1, and gtt at 1300 units/hr F/u 8 hour heparin level F/u cards plan  Bertis Ruddy, PharmD, [place] Pharmacist ED Pharmacist Phone # [phone] 05/20/2022 6:30 PM

## 2022-05-20 NOTE — ED Provider Notes (Signed)
[facility] Provider Note   CSN: [id number] Arrival date & time: 05/20/22  1141     History {Add pertinent medical, surgical, social history, OB history to HPI:1} Chief Complaint  Patient presents with   Chest Pain    Nicholas Carroll is a 72 y.o. male.  HPI Patient presents with concern of chest pain, dyspnea.  History is notable for stent, he states that he takes his aspirin usually, though not always. Over the past week he has developed increasing chest pressure, dyspnea, particular with exertion. No fever, no vomiting, no abdominal pain.    Home Medications Prior to Admission medications   Medication Sig Start Date End Date Taking? Authorizing Provider  amLODipine (NORVASC) 5 MG tablet Take 1 tablet (5 mg total) by mouth daily. 07/04/18   Dixie Dials, MD  aspirin 81 MG tablet Take 81 mg by mouth daily.    [provider]  atorvastatin (LIPITOR) 40 MG tablet Take 1 tablet (40 mg total) by mouth daily at 6 PM. 07/03/18   Dixie Dials, MD  isosorbide mononitrate (IMDUR) 30 MG 24 hr tablet Take 1 tablet (30 mg total) by mouth daily. 01/06/21   Dixie Dials, MD  lisinopril (PRINIVIL,ZESTRIL) 10 MG tablet Take 1 tablet (10 mg total) by mouth daily. 06/27/18   Drenda Freeze, MD  nitroGLYCERIN (NITROSTAT) 0.4 MG SL tablet Place 1 tablet (0.4 mg total) under the tongue every 5 (five) minutes x 3 doses as needed for chest pain. 07/03/18   Dixie Dials, MD      Allergies    Patient has no known allergies.    Review of Systems   Review of Systems  All other systems reviewed and are negative.   Physical Exam Updated Vital Signs BP 126/71   Pulse 63   Temp 98.7 F (37.1 C) (Oral)   Resp 20   Ht 6' 3"$  (1.905 m)   Wt 117 kg   SpO2 99%   BMI 32.24 kg/m  Physical Exam Vitals and nursing note reviewed.  Constitutional:      General: He is not in acute distress.    Appearance: He is well-developed.  HENT:     Head:  Normocephalic and atraumatic.  Eyes:     Conjunctiva/sclera: Conjunctivae normal.  Cardiovascular:     Rate and Rhythm: Normal rate and regular rhythm.  Pulmonary:     Effort: Pulmonary effort is normal. No respiratory distress.     Breath sounds: No stridor.  Abdominal:     General: There is no distension.  Skin:    General: Skin is warm and dry.  Neurological:     Mental Status: He is alert and oriented to person, place, and time.     ED Results / Procedures / Treatments   Labs (all labs ordered are listed, but only abnormal results are displayed) Labs Reviewed  CBC  COMPREHENSIVE METABOLIC PANEL  BRAIN NATRIURETIC PEPTIDE  TROPONIN I (HIGH SENSITIVITY)    EKG EKG Interpretation  Date/Time:  Monday May 20 2022 12:02:44 EST Ventricular Rate:  61 PR Interval:  160 QRS Duration: 80 QT Interval:  420 QTC Calculation: 422 R Axis:   14 Text Interpretation: Normal sinus rhythm Normal ECG Confirmed by Carmin Muskrat [phone]) on 05/20/2022 12:36:38 PM  Radiology No results found.  Procedures Procedures  {Document cardiac monitor, telemetry assessment procedure when appropriate:1}  Medications Ordered in ED Medications - No data to display  ED Course/ Medical Decision Making/  A&P   {   Click here for ABCD2, HEART and other calculatorsREFRESH Note before signing :1}                          Medical Decision Making Patient with history of hypertension, CAD with prior stents presents with chest pain, dyspnea.  Differential including ACS, heart failure, pneumonia, renal dysfunction, hepatic dysfunction all considered, patient started on continuous monitoring, labs sent, x-ray sent.  Cardiac 65 sinus normal Pulse ox 100% room air normal   Amount and/or Complexity of Data Reviewed External Data Reviewed: notes.    Details: Cath last year reviewed Labs: ordered. Decision-making details documented in ED Course. Radiology: ordered and independent interpretation  performed. Decision-making details documented in ED Course. ECG/medicine tests: ordered and independent interpretation performed. Decision-making details documented in ED Course.  Risk OTC drugs. Prescription drug management. Decision regarding hospitalization.   ***  {Document critical care time when appropriate:1} {Document review of labs and clinical decision tools ie heart score, Chads2Vasc2 etc:1}  {Document your independent review of radiology images, and any outside records:1} {Document your discussion with family members, caretakers, and with consultants:1} {Document social determinants of health affecting pt's care:1} {Document your decision making why or why not admission, treatments were needed:1} Final Clinical Impression(s) / ED Diagnoses Final diagnoses:  None    Rx / DC Orders ED Discharge Orders     None

## 2022-05-20 NOTE — ED Provider Notes (Signed)
  Physical Exam  BP 139/76   Pulse 66   Temp 98.3 F (36.8 C) (Oral)   Resp 20   Ht '6\' 3"'$  (1.905 m)   Wt 117 kg   SpO2 97%   BMI 32.24 kg/m   Physical Exam  Procedures  Procedures  ED Course / MDM    Medical Decision Making Care assumed at 4 PM from Dr. Vanita Panda.  Patient has CAD with 40% stenosis.  Patient has chest pain with exertion.  Initial troponin is 44 and he discussed with Dr. Terrence Dupont anticipate discharge if troponin is stable  6:02 PM Patient's repeat troponin is 48.  However he still has chest pressure and is uncomfortable going home.  I discussed case with Dr. Terrence Dupont again and we will admit patient for further workup.  He recommend medicine admission and he will see patient as a consult  Amount and/or Complexity of Data Reviewed Labs: ordered. Radiology: ordered. ECG/medicine tests: ordered.  Risk Prescription drug management. Decision regarding hospitalization.          Drenda Freeze, MD 05/20/22 [phone]

## 2022-05-21 ENCOUNTER — Encounter (HOSPITAL_COMMUNITY)
Admission: EM | Disposition: A | Discharge status: Home / Self Care | Payer: Self-pay | Source: Home / Self Care | Attending: Emergency Medicine

## 2022-05-21 DIAGNOSIS — I209 Angina pectoris, unspecified: Secondary | ICD-10-CM | POA: Diagnosis not present

## 2022-05-21 DIAGNOSIS — I25110 Atherosclerotic heart disease of native coronary artery with unstable angina pectoris: Secondary | ICD-10-CM | POA: Diagnosis not present

## 2022-05-21 HISTORY — PX: LEFT HEART CATH AND CORONARY ANGIOGRAPHY: CATH118249

## 2022-05-21 LAB — CBC
HCT: 37.6 % — ABNORMAL LOW (ref 39.0–52.0)
Hemoglobin: 13.1 g/dL (ref 13.0–17.0)
MCH: 29.7 pg (ref 26.0–34.0)
MCHC: 34.8 g/dL (ref 30.0–36.0)
MCV: 85.3 fL (ref 80.0–100.0)
Platelets: 201 10*3/uL (ref 150–400)
RBC: 4.41 MIL/uL (ref 4.22–5.81)
RDW: 14.7 % (ref 11.5–15.5)
WBC: 4.3 10*3/uL (ref 4.0–10.5)
nRBC: 0 % (ref 0.0–0.2)

## 2022-05-21 LAB — BASIC METABOLIC PANEL
Anion gap: 8 (ref 5–15)
BUN: 10 mg/dL (ref 8–23)
CO2: 25 mmol/L (ref 22–32)
Calcium: 8.9 mg/dL (ref 8.9–10.3)
Chloride: 103 mmol/L (ref 98–111)
Creatinine, Ser: 0.91 mg/dL (ref 0.61–1.24)
GFR, Estimated: >60 mL/min (ref >60)
Glucose, Bld: 96 mg/dL (ref 70–99)
Potassium: 3.1 mmol/L — ABNORMAL LOW (ref 3.5–5.1)
Sodium: 136 mmol/L (ref 135–145)

## 2022-05-21 LAB — HEPARIN LEVEL (UNFRACTIONATED): Heparin Unfractionated: 0.23 IU/mL — ABNORMAL LOW (ref 0.30–0.70)

## 2022-05-21 SURGERY — LEFT HEART CATH AND CORONARY ANGIOGRAPHY
Anesthesia: LOCAL

## 2022-05-21 MED ORDER — HYDRALAZINE HCL 20 MG/ML IJ SOLN: 10.0000 mg | INTRAMUSCULAR | Status: DC | PRN

## 2022-05-21 MED ORDER — ACETAMINOPHEN 325 MG PO TABS: 650.0000 mg | ORAL_TABLET | ORAL | Status: DC | PRN

## 2022-05-21 MED ORDER — LABETALOL HCL 5 MG/ML IV SOLN: 10.0000 mg | INTRAVENOUS | Status: DC | PRN

## 2022-05-21 MED ORDER — VERAPAMIL HCL 2.5 MG/ML IV SOLN
INTRAVENOUS | Status: DC | PRN
  Administered 2022-05-21: 10 mL via INTRA_ARTERIAL

## 2022-05-21 MED ORDER — HEPARIN (PORCINE) IN NACL 1000-0.9 UT/500ML-% IV SOLN
INTRAVENOUS | Status: DC | PRN
  Administered 2022-05-21 (×2): 500 mL

## 2022-05-21 MED ORDER — HEPARIN SODIUM (PORCINE) 1000 UNIT/ML IJ SOLN
INTRAMUSCULAR | Status: AC
  Filled 2022-05-21: qty 10

## 2022-05-21 MED ORDER — SODIUM CHLORIDE 0.9 % IV SOLN: INTRAVENOUS | Status: DC

## 2022-05-21 MED ORDER — ONDANSETRON HCL 4 MG/2ML IJ SOLN: 4.0000 mg | Freq: Four times a day (QID) | INTRAMUSCULAR | Status: DC | PRN

## 2022-05-21 MED ORDER — POTASSIUM CHLORIDE CRYS ER 20 MEQ PO TBCR
40.0000 meq | EXTENDED_RELEASE_TABLET | Freq: Two times a day (BID) | ORAL | Status: AC
  Administered 2022-05-21 (×2): 40 meq via ORAL
  Filled 2022-05-21 (×2): qty 2

## 2022-05-21 MED ORDER — INFLUENZA VAC A&B SA ADJ QUAD 0.5 ML IM PRSY
0.5000 mL | PREFILLED_SYRINGE | INTRAMUSCULAR | Status: AC
  Administered 2022-05-22: 0.5 mL via INTRAMUSCULAR
  Filled 2022-05-21 (×2): qty 0.5

## 2022-05-21 MED ORDER — MIDAZOLAM HCL 2 MG/2ML IJ SOLN
INTRAMUSCULAR | Status: DC | PRN
  Administered 2022-05-21: 1 mg via INTRAVENOUS

## 2022-05-21 MED ORDER — LIDOCAINE HCL (PF) 1 % IJ SOLN
INTRAMUSCULAR | Status: DC | PRN
  Administered 2022-05-21: 2 mL

## 2022-05-21 MED ORDER — FENTANYL CITRATE (PF) 100 MCG/2ML IJ SOLN
INTRAMUSCULAR | Status: AC
  Filled 2022-05-21: qty 2

## 2022-05-21 MED ORDER — MIDAZOLAM HCL 2 MG/2ML IJ SOLN
INTRAMUSCULAR | Status: AC
  Filled 2022-05-21: qty 2

## 2022-05-21 MED ORDER — VERAPAMIL HCL 2.5 MG/ML IV SOLN
INTRAVENOUS | Status: AC
  Filled 2022-05-21: qty 2

## 2022-05-21 MED ORDER — FENTANYL CITRATE (PF) 100 MCG/2ML IJ SOLN
INTRAMUSCULAR | Status: DC | PRN
  Administered 2022-05-21: 25 ug via INTRAVENOUS

## 2022-05-21 MED ORDER — LIDOCAINE HCL (PF) 1 % IJ SOLN
INTRAMUSCULAR | Status: AC
  Filled 2022-05-21: qty 30

## 2022-05-21 MED ORDER — SODIUM CHLORIDE 0.9 % WEIGHT BASED INFUSION: 3.0000 mL/kg/h | INTRAVENOUS | Status: DC

## 2022-05-21 MED ORDER — CLOPIDOGREL BISULFATE 75 MG PO TABS
75.0000 mg | ORAL_TABLET | Freq: Every day | ORAL | Status: DC
  Administered 2022-05-22: 75 mg via ORAL
  Filled 2022-05-21: qty 1

## 2022-05-21 MED ORDER — SODIUM CHLORIDE 0.9% FLUSH: 3.0000 mL | INTRAVENOUS | Status: DC | PRN

## 2022-05-21 MED ORDER — HEPARIN SODIUM (PORCINE) 1000 UNIT/ML IJ SOLN
INTRAMUSCULAR | Status: DC | PRN
  Administered 2022-05-21: 6000 [IU] via INTRAVENOUS

## 2022-05-21 MED ORDER — IOHEXOL 350 MG/ML SOLN
INTRAVENOUS | Status: DC | PRN
  Administered 2022-05-21: 65 mL

## 2022-05-21 MED ORDER — ASPIRIN 81 MG PO CHEW
81.0000 mg | CHEWABLE_TABLET | ORAL | Status: AC
  Administered 2022-05-21: 81 mg via ORAL
  Filled 2022-05-21: qty 1

## 2022-05-21 MED ORDER — SODIUM CHLORIDE 0.9 % IV SOLN: 250.0000 mL | INTRAVENOUS | Status: DC | PRN

## 2022-05-21 MED ORDER — PNEUMOCOCCAL 20-VAL CONJ VACC 0.5 ML IM SUSY
0.5000 mL | PREFILLED_SYRINGE | INTRAMUSCULAR | Status: AC
  Administered 2022-05-22: 0.5 mL via INTRAMUSCULAR
  Filled 2022-05-21 (×2): qty 0.5

## 2022-05-21 MED ORDER — ACETAMINOPHEN 325 MG PO TABS
650.0000 mg | ORAL_TABLET | Freq: Four times a day (QID) | ORAL | Status: DC | PRN
  Administered 2022-05-22: 650 mg via ORAL
  Filled 2022-05-21: qty 2

## 2022-05-21 MED ORDER — NITROGLYCERIN 0.4 MG SL SUBL
0.4000 mg | SUBLINGUAL_TABLET | Freq: Once | SUBLINGUAL | Status: AC
  Administered 2022-05-21: 0.4 mg via SUBLINGUAL
  Filled 2022-05-21: qty 1

## 2022-05-21 MED ORDER — SODIUM CHLORIDE 0.9% FLUSH
3.0000 mL | Freq: Two times a day (BID) | INTRAVENOUS | Status: DC
  Administered 2022-05-21 – 2022-05-22 (×2): 3 mL via INTRAVENOUS

## 2022-05-21 MED ORDER — SODIUM CHLORIDE 0.9 % WEIGHT BASED INFUSION: 1.0000 mL/kg/h | INTRAVENOUS | Status: DC

## 2022-05-21 MED ORDER — HEPARIN (PORCINE) IN NACL 1000-0.9 UT/500ML-% IV SOLN
INTRAVENOUS | Status: AC
  Filled 2022-05-21: qty 1000

## 2022-05-21 SURGICAL SUPPLY — 11 items
CATH 5FR JL3.5 JR4 ANG PIG MP (CATHETERS) IMPLANT
CATH INFINITI 5 FR IM (CATHETERS) IMPLANT
DEVICE RAD COMP TR BAND LRG (VASCULAR PRODUCTS) IMPLANT
GLIDESHEATH SLEND A-KIT 6F 22G (SHEATH) IMPLANT
GUIDEWIRE INQWIRE 1.5J.035X260 (WIRE) IMPLANT
INQWIRE 1.5J .035X260CM (WIRE) ×2
KIT HEART LEFT (KITS) ×1 IMPLANT
PACK CARDIAC CATHETERIZATION (CUSTOM PROCEDURE TRAY) ×1 IMPLANT
SHEATH PROBE COVER 6X72 (BAG) IMPLANT
TRANSDUCER W/STOPCOCK (MISCELLANEOUS) ×1 IMPLANT
TUBING CIL FLEX 10 FLL-RA (TUBING) ×1 IMPLANT

## 2022-05-21 NOTE — Interval H&P Note (Signed)
History and Physical Interval Note:  05/21/2022 12:31 PM  Nicholas Carroll  has presented today for surgery, with the diagnosis of unstable angina.  The various methods of treatment have been discussed with the patient and family. After consideration of risks, benefits and other options for treatment, the patient has consented to  Procedure(s): LEFT HEART CATH AND CORONARY ANGIOGRAPHY (N/A) as a surgical intervention.  The patient's history has been reviewed, patient examined, no change in status, stable for surgery.  I have reviewed the patient's chart and labs.  Questions were answered to the patient's satisfaction.    2016 Appropriate Use Criteria for Coronary Revascularization in Patients With Acute Coronary Syndrome NSTEMI/Unstable angina, stabilized patient at high risk Indication:  Revascularization by PCI or CABG of 1 or more arteries in a patient with NSTEMI or unstable angina with Stabilization after presentation High risk for clinical events A (7) Indication: 16; Score 7   [place]

## 2022-05-21 NOTE — Progress Notes (Signed)
     Daily Progress Note Intern Pager: [phone]  Patient name: Nicholas Carroll Medical record number: [id number] Date of birth: [birth year] Age: 72 y.o. Gender: male  Primary Care Provider: Lin Landsman, MD Consultants: Cardiology Code Status: Full Code  Pt Overview and Major Events to Date:  2/12: Admitted 2/13: Planned heart cath  Assessment and Plan: Nicholas Carroll is a 72 y.o. male presenting with chest pain. Past medical history includes CAD with previous DES, HTN, HLD,   * Chest pain Evaluated patient at bedside prior to catheterization.  Endorsing squeezing chest pain 8/10.  Blood pressure of 133/82 with heart rate greater than 60. -Cardiology consulted, appreciate recommendations - 0900 cath - Hold metoprolol for low heart rates in 50s - One-time sublingual nitro prior to cath - Heparin gtt, dosing per pharmacy   - Continue home atorvastatin, aspirin  - Cardiac telemetry  - Vitals per floor  - PT eval and treat  Essential hypertension On amlodipine 5 mg daily and metoprolol 12.5 BID  - continue amlodipine  - Hold metoprolol for HR < 60, or SBP < 100    FEN/GI: NPO PPx: Heparin drip Dispo: Pending heart cath  Subjective:  8/10 squeezing chest pain in center of chest.   Objective: Temp:  [97.6 F (36.4 C)-98.7 F (37.1 C)] 98 F (36.7 C) (02/13 0706) Pulse Rate:  [53-75] 60 (02/13 0637) Resp:  [12-20] 18 (02/13 0637) BP: (114-154)/(66-86) 126/71 (02/13 0637) SpO2:  [91 %-99 %] 96 % (02/13 0846) Weight:  [151 kg] 117 kg (02/12 1159) Physical Exam: General: NAD Cardiovascular: RRR, no murmurs Respiratory: CTAB, normal wob on RA Extremities: bilateral trace pitting edema   Laboratory: Most recent CBC Lab Results  Component Value Date   WBC 4.3 05/21/2022   HGB 13.1 05/21/2022   HCT 37.6 (L) 05/21/2022   MCV 85.3 05/21/2022   PLT 201 05/21/2022   Most recent BMP    Latest Ref Rng & Units 05/21/2022    4:32 AM  BMP  Glucose 70 - 99 mg/dL 96    BUN 8 - 23 mg/dL 10   Creatinine 0.61 - 1.24 mg/dL 0.91   Sodium 135 - 145 mmol/L 136   Potassium 3.5 - 5.1 mmol/L 3.1   Chloride 98 - 111 mmol/L 103   CO2 22 - 32 mmol/L 25   Calcium 8.9 - 10.3 mg/dL 8.9    Leslie Dales, DO 05/21/2022, 8:56 AM  PGY-1, [place] Intern pager: [phone], text pages welcome Secure chat group [place]

## 2022-05-21 NOTE — Progress Notes (Signed)
PT Cancellation Note  Patient Details Name: Nicholas Carroll MRN: [id number] DOB: [birth year]   Cancelled Treatment:    Reason Eval/Treat Not Completed: Other (comment).  Severe AM chest pain, then was sent for heart cath.  Follow up tomorrow.   Ramond Dial 05/21/2022, 12:43 PM  Mee Hives, PT PhD Acute Rehab Dept. Number: [place] [facility] [place]

## 2022-05-21 NOTE — Plan of Care (Signed)

## 2022-05-21 NOTE — Progress Notes (Signed)
Subjective:  Patient denies any chest pain or shortness of breath results of left cardiac catheterization noted.  opted for medical management for now  Objective:  Vital Signs in the last 24 hours: Temp:  [97.6 F (36.4 C)-99.4 F (37.4 C)] 99.4 F (37.4 C) (02/13 1634) Pulse Rate:  [52-75] 66 (02/13 1634) Resp:  [12-28] 21 (02/13 1555) BP: (105-154)/(62-96) 126/62 (02/13 1634) SpO2:  [94 %-99 %] 99 % (02/13 1634) Weight:  [117.9 kg] 117.9 kg (02/13 1634)  Intake/Output from previous day: No intake/output data recorded. Intake/Output from this shift: Total I/O In: -  Out: 200 [Urine:200]  Physical Exam: Exam unchanged Right radial cath site stable Lab Results: Recent Labs    05/20/22 1530 05/21/22 0432  WBC 5.4 4.3  HGB 13.1 13.1  PLT 203 201   Recent Labs    05/20/22 1259 05/21/22 0432  NA 135 136  K 3.9 3.1*  CL 104 103  CO2 22 25  GLUCOSE 82 96  BUN 12 10  CREATININE 0.94 0.91   No results for input(s): "TROPONINI" in the last 72 hours.  Invalid input(s): "CK", "MB" Hepatic Function Panel Recent Labs    05/20/22 1259  PROT 6.6  ALBUMIN 3.5  AST 34  ALT 27  ALKPHOS 65  BILITOT 1.6*   No results for input(s): "CHOL" in the last 72 hours. No results for input(s): "PROTIME" in the last 72 hours.  Imaging: Imaging results have been reviewed and CARDIAC CATHETERIZATION  Result Date: 05/21/2022 Images from the original result were not included. LM: Mid eccentric 40-50% ISR (Unchanged since 12/2020) LAD: Prox 20%, mid 50%, ostial diag 30% disease (Unchanged since 12/2020) Lcx: Small caliber, diffuse 40% disease (Unchanged since 12/2020) Ramus: Large vessel. Mid focal 70% stenosis (Slightly progressed since 12/2020) RCA: Mid diffuse 40%, distal 20-30% disease (Unchanged since 12/2020) LVEDP 10 mmHg LVEF 55-60% Minimally elevated and flat troponin similar to 12/2020. He does have exertional angina symptoms. However, he is not compliant with medical therapy,  including Aspirin. If symptoms persist in spite of aggressive medical therapy, we could consider revascularization. Given the left main stenosis is upper end of moderate, but stable, we could consider PCI to ramus alone. If LM disease progresses, could consider heart team discussion for CABG vs PCI. [name], MD Pager: [phone] Office: [phone]  DG Chest 2 View  Result Date: 05/20/2022 CLINICAL DATA:  Intermittent chest pain radiating to the left arm EXAM: CHEST - 2 VIEW COMPARISON:  05/05/2020 FINDINGS: Heart size upper limits of normal. Mildly tortuous aorta. Both lungs are clear. The visualized skeletal structures are unremarkable. IMPRESSION: No active cardiopulmonary disease. Electronically Signed   By: Nelson Chimes M.D.   On: 05/20/2022 14:52    Cardiac Studies:  Assessment/Plan:  Acute coronary syndrome status post left cardiac catheterization Coronary artery disease history of unprotected left main stenting in April 2011 Hypertensive heart disease Hyperlipidemia Morbid obesity Erectile dysfunction Remote tobacco abuse Benign hypertrophy of prostate Plan Add clopidogrel 75 mg daily Increase ambulation as tolerated Discussed at length with patient regarding compliance with medication diet and lifestyle changes Possible discharge tomorrow if stable  LOS: 0 days    Charolette Forward 05/21/2022, 4:52 PM

## 2022-05-21 NOTE — Progress Notes (Signed)
[facility] for heparin Indication: chest pain/ACS  No Known Allergies  Patient Measurements: Height: 6' 3"$  (190.5 cm) Weight: 117 kg (257 lb 15 oz) IBW/kg (Calculated) : 84.5 Heparin Dosing Weight: 109kg  Vital Signs: Temp: 97.9 F (36.6 C) (02/12 2355) Temp Source: Oral (02/12 2355) BP: 114/77 (02/13 0430) Pulse Rate: 61 (02/13 0430)  Labs: Recent Labs    05/20/22 1259 05/20/22 1530 05/21/22 0432  HGB  --  13.1 13.1  HCT  --  37.2* 37.6*  PLT  --  203 201  HEPARINUNFRC  --   --  0.23*  CREATININE 0.94  --  0.91  TROPONINIHS 44* 48*  --      Estimated Creatinine Clearance: 102.7 mL/min (by C-G formula based on SCr of 0.91 mg/dL).   Medical History: Past Medical History:  Diagnosis Date   Chest pain 06/2018   Coronary artery disease    Hyperlipidemia    Hypertension    Obesity    Sexual dysfunction      Assessment: 5 YOM presenting with recurrent CP, hx of CAD, he is not on anticoagulation PTA  2/13 AM update:   Heparin level sub-therapeutic   Goal of Therapy:  Heparin level 0.3-0.7 units/ml Monitor platelets by anticoagulation protocol: Yes   Plan:  Inc heparin to 1500 units/hr 1300 heparin level Likely cath today  Narda Bonds, PharmD, BCPS Clinical Pharmacist Phone: [phone]

## 2022-05-21 NOTE — Consult Note (Signed)
CARDIOLOGY CONSULT NOTE  Patient ID: Nicholas Carroll MRN: [id number] DOB/AGE: Aug 06, 1950 72 y.o.  Admit date: 05/20/2022 Referring Physician  Dr. Charolette Forward, MD Primary Physician:  Lin Landsman, MD Reason for Consultation  Unstable angina pectoris  Patient ID: Nicholas Carroll, male    DOB: 14-Jun-1950, 72 y.o.   MRN: [id number]  Chief Complaint  Patient presents with   Chest Pain   HPI:    Nicholas Carroll  is a 72 y.o. Male patient with coronary artery disease with left main stenting in 2011 with a focal 4.00 x 15 mm DES, primary hypertension, hypercholesterolemia, morbid obesity, remote tobacco use disorder, last cardiac catheterization 01/04/2021 revealing patent stent, presents with recurrent episodes of chest pain to the emergency room.  Patient was evaluated by his primary cardiologist Dr. Terrence Dupont and felt that his presentation was more consistent with unstable angina, in view of left main disease, 67 he is to have further cardiac workup to exclude progression of coronary disease and also to evaluate for potential restenosis in the left main.  Chest pain episode started about a week ago described as tightness in the middle of the chest and heaviness, lasting anywhere from 5 to 10 minutes with occasional episodes of associated nausea and diaphoresis with exertional activity, and also dyspnea. Some episodes occurred at rest.  He also states that chest pain occurs when he leans forward getting up from a chair or when he takes a deep breath.  He also points to the chest pain at 1 focal area sometimes.  His wife is present.  Patient states that since being in the hospital at rest, she has not had any further episodes of chest pain.  Past Medical History:  Diagnosis Date   Chest pain 06/2018   Coronary artery disease    Hyperlipidemia    Hypertension    Obesity    Sexual dysfunction    Past Surgical History:  Procedure Laterality Date   CARDIAC CATHETERIZATION N/A 12/23/2014    Procedure: Left Heart Cath and Coronary Angiography;  Surgeon: Dixie Dials, MD;  Location: [facility];  Service: Cardiovascular;  Laterality: N/A;   CORONARY STENT INTERVENTION     KNEE SURGERY     LEFT HEART CATH AND CORONARY ANGIOGRAPHY N/A 01/04/2021   Procedure: LEFT HEART CATH AND CORONARY ANGIOGRAPHY;  Surgeon: Dixie Dials, MD;  Location: [facility];  Service: Cardiovascular;  Laterality: N/A;   Social History   Tobacco Use   Smoking status: Former    Years: 2.00    Types: Cigarettes   Smokeless tobacco: Never  Substance Use Topics   Alcohol use: No    Family History  Problem Relation Age of Onset   Other Other        Pacemaker    Marital Status: Married  ROS  Review of Systems  Cardiovascular:  Positive for chest pain and claudication. Negative for dyspnea on exertion and leg swelling.   Objective      05/21/2022    2:30 AM 05/20/2022   11:00 PM 05/20/2022    9:00 PM  Vitals with BMI  Systolic [id number]  Diastolic 73 84 78  Pulse 56 75 59    Blood pressure 114/73, pulse (!) 56, temperature 97.9 F (36.6 C), temperature source Oral, resp. rate 13, height 6' 3"$  (1.905 m), weight 117 kg, SpO2 96 %.  Physical Exam Constitutional:      Appearance: He is obese.  Neck:     Vascular:  No carotid bruit or JVD.  Cardiovascular:     Rate and Rhythm: Normal rate and regular rhythm.     Pulses: Intact distal pulses.          Femoral pulses are 2+ on the right side and 2+ on the left side.      Dorsalis pedis pulses are 0 on the right side and 2+ on the left side.       Posterior tibial pulses are 0 on the right side and 0 on the left side.     Heart sounds: Normal heart sounds. No murmur heard.    No gallop.  Pulmonary:     Effort: Pulmonary effort is normal.     Breath sounds: Normal breath sounds.  Abdominal:     General: Bowel sounds are normal.     Palpations: Abdomen is soft.  Musculoskeletal:     Right lower leg: No edema.     Left lower  leg: No edema.    Laboratory examination:   Recent Labs    05/20/22 1259  NA 135  K 3.9  CL 104  CO2 22  GLUCOSE 82  BUN 12  CREATININE 0.94  CALCIUM 9.1  GFRNONAA >60      Latest Ref Rng & Units 05/20/2022   12:59 PM 01/05/2021    2:16 AM 01/04/2021    2:30 AM  CMP  Glucose 70 - 99 mg/dL 82  117  128   BUN 8 - 23 mg/dL 12  10  8   $ Creatinine 0.61 - 1.24 mg/dL 0.94  1.09  1.01   Sodium 135 - 145 mmol/L 135  137  138   Potassium 3.5 - 5.1 mmol/L 3.9  3.8  3.5   Chloride 98 - 111 mmol/L 104  107  107   CO2 22 - 32 mmol/L 22  24  27   $ Calcium 8.9 - 10.3 mg/dL 9.1  8.8  8.8   Total Protein 6.5 - 8.1 g/dL 6.6     Total Bilirubin 0.3 - 1.2 mg/dL 1.6     Alkaline Phos 38 - 126 U/L 65     AST 15 - 41 U/L 34     ALT 0 - 44 U/L 27         Latest Ref Rng & Units 05/20/2022    3:30 PM 01/06/2021    1:53 AM 01/05/2021    2:16 AM  CBC  WBC 4.0 - 10.5 K/uL 5.4  5.6  4.9   Hemoglobin 13.0 - 17.0 g/dL 13.1  12.8  12.6   Hematocrit 39.0 - 52.0 % 37.2  37.9  37.7   Platelets 150 - 400 K/uL 203  190  202    Lab Results  Component Value Date   CHOL 114 01/04/2021   HDL 37 (L) 01/04/2021   LDLCALC 62 01/04/2021   TRIG 76 01/04/2021   CHOLHDL 3.1 01/04/2021    HEMOGLOBIN A1C Lab Results  Component Value Date   HGBA1C 6.2 (H) 01/05/2021   MPG 131.24 01/05/2021   No results found for: "TSH"  BNP (last 3 results) Recent Labs    05/20/22 1530  BNP 18.5   Cardiac Panel (last 3 results) Recent Labs    05/20/22 1259 05/20/22 1530  TROPONINIHS 44* 48*     Medications and allergies  No Known Allergies   Current Meds  Medication Sig   amLODipine (NORVASC) 5 MG tablet Take 1 tablet (5 mg total) by mouth daily.   aspirin  81 MG tablet Take 81 mg by mouth daily.   atorvastatin (LIPITOR) 40 MG tablet Take 1 tablet (40 mg total) by mouth daily at 6 PM. (Patient taking differently: Take 40 mg by mouth daily.)   hydrochlorothiazide (HYDRODIURIL) 12.5 MG tablet Take 1 tablet by  mouth daily.   isosorbide mononitrate (IMDUR) 30 MG 24 hr tablet Take 1 tablet (30 mg total) by mouth daily.   lisinopril (PRINIVIL,ZESTRIL) 10 MG tablet Take 1 tablet (10 mg total) by mouth daily.   nitroGLYCERIN (NITROSTAT) 0.4 MG SL tablet Place 1 tablet (0.4 mg total) under the tongue every 5 (five) minutes x 3 doses as needed for chest pain.   tamsulosin (FLOMAX) 0.4 MG CAPS capsule Take 0.4 mg by mouth daily.    Scheduled Meds:  amLODipine  5 mg Oral Daily   aspirin EC  81 mg Oral Daily   atorvastatin  80 mg Oral Daily   metoprolol tartrate  12.5 mg Oral BID   Continuous Infusions:  heparin 1,300 Units/hr (05/20/22 1848)   Radiology:   DG Chest 2 View  Result Date: 05/20/2022 CLINICAL DATA:  Intermittent chest pain radiating to the left arm EXAM: CHEST - 2 VIEW COMPARISON:  05/05/2020 FINDINGS: Heart size upper limits of normal. Mildly tortuous aorta. Both lungs are clear. The visualized skeletal structures are unremarkable. IMPRESSION: No active cardiopulmonary disease. Electronically Signed   By: Nelson Chimes M.D.   On: 05/20/2022 14:52    Cardiac Studies:   Lexiscan nuclear stress test 01/05/2021: 1. No reversible ischemia or infarction. 2. Normal left ventricular wall motion. 3. Left ventricular ejection fraction 51%  Echocardiogram 12/15/2020: 1. Left ventricular ejection fraction, by estimation, is 60 to 65%. The left ventricle has normal function. The left ventricle has no regional wall motion abnormalities. Left ventricular diastolic parameters are consistent with Grade I diastolic  dysfunction (impaired relaxation).  2. Right ventricular systolic function is normal. The right ventricular size is normal. There is normal pulmonary artery systolic pressure.  3. Left atrial size was mildly dilated.  4. Right atrial size was mildly dilated.  5. The mitral valve is degenerative. Mild mitral valve regurgitation.  6. The aortic valve is tricuspid. There is mild calcification  of the aortic valve. There is mild thickening of the aortic valve. Aortic valve regurgitation is mild. Mild aortic valve sclerosis is present, with no evidence of aortic valve stenosis.  7. The inferior vena cava is normal in size with greater than 50% respiratory variability, suggesting right atrial pressure of 3 mmHg.  Coronary angiogram 01/04/2021:   Prox LAD lesion is 20% stenosed.   Prox RCA to Mid RCA lesion is 15% stenosed.   Mid RCA lesion is 35% stenosed.   Dist RCA lesion is 20% stenosed.   1st Diag lesion is 25% stenosed.   Ost LM to Mid LM lesion is 50% stenosed.   Mid LAD lesion is 45% stenosed.   Ost Cx to Dist Cx lesion is 60% stenosed.  Cardiac catheterization 07/28/2009: LM: History of left main stenting.  SP 4.0 x 15 mm thrombosed stent in the ostium and proximal left main, successful balloon angioplasty followed by confirmation with IVUS.  EKG:  EKG 05/20/2022: Normal sinus rhythm at rate of 61 bpm, normal EKG.  No evidence of ischemia.  Assessment   1.  Unstable angina pectoris 2.  Coronary artery disease with history of 4.0 x 15 mm DES stent placed sometime in 2011, patient presenting with stent thrombosis NSTEMI on 07/28/2009 needing  angioplasty, repeat cardiac catheterization x 2 in the past revealing widely patent stent with mild in-stent restenosis and moderate disease in the native vessels. 3.  Primary hypertension 4.  Hypercholesterolemia 5.  Peripheral arterial disease with symptoms of right calf claudication.  Recommendations:   Nicholas Carroll is a 72 y.o. Male patient with coronary artery disease with left main stenting in 2011 with a focal 4.00 x 15 mm DES, primary hypertension, hypercholesterolemia, morbid obesity, remote tobacco use disorder, last cardiac catheterization 01/04/2021 revealing patent stent, presents with recurrent episodes of chest pain to the emergency room.  Patient serum troponins are fairly flat but mildly elevated.  No EKG  abnormalities, presently on IV heparin and remains chest pain-free.   Chest pain symptoms mostly indicator of musculoskeletal etiology but also has a component of angina pectoris as well.  In view of left main stenting, will proceed with cardiac catheterization.  I discussed with the patient regarding continued observation, versus invasive strategy.  Patient has already had a stress test recently in September 2022 revealing no evidence of ischemia, now presenting with chest discomfort that is nitrate responsive and also has exertional component.  Will proceed with cardiac catheterization and possible angioplasty to evaluate for progression of coronary disease and/or restenosis in the left main stent.  Patient's blood pressure is well-controlled.  He is also on >2 antianginal medications.  Lipids are being managed by high intensity statin.  LDL is at goal.  He has symptoms of claudication involving his right lower extremity and abnormal physical examination.  He frequently stops his activity due to leg cramps in his right calf.  He will need further workup and/or therapy for the same.  Schedule for cardiac catheterization, and possible angioplasty. We discussed regarding risks, benefits, alternatives to this including stress testing, CTA and continued medical therapy. Patient wants to proceed. Understands <1-2% risk of death, stroke, MI, urgent CABG, bleeding, infection, renal failure but not limited to these.    Adrian Prows, MD, [facility] 05/21/2022, 3:01 AM Office: [phone]

## 2022-05-21 NOTE — H&P (View-Only) (Signed)
CARDIOLOGY CONSULT NOTE  Patient ID: Nicholas Carroll MRN: [id number] DOB/AGE: [birth year] 72 y.o.  Admit date: 05/20/2022 Referring Physician  Dr. Charolette Forward, MD Primary Physician:  Lin Landsman, MD Reason for Consultation  Unstable angina pectoris  Patient ID: Nicholas Carroll, male    DOB: [birth year], 72 y.o.   MRN: [id number]  Chief Complaint  Patient presents with   Chest Pain   HPI:    [name]  is a 72 y.o. Male patient with coronary artery disease with left main stenting in 2011 with a focal 4.00 x 15 mm DES, primary hypertension, hypercholesterolemia, morbid obesity, remote tobacco use disorder, last cardiac catheterization 01/04/2021 revealing patent stent, presents with recurrent episodes of chest pain to the emergency room.  Patient was evaluated by his primary cardiologist Dr. Terrence Dupont and felt that his presentation was more consistent with unstable angina, in view of left main disease, 87 he is to have further cardiac workup to exclude progression of coronary disease and also to evaluate for potential restenosis in the left main.  Chest pain episode started about a week ago described as tightness in the middle of the chest and heaviness, lasting anywhere from 5 to 10 minutes with occasional episodes of associated nausea and diaphoresis with exertional activity, and also dyspnea. Some episodes occurred at rest.  He also states that chest pain occurs when he leans forward getting up from a chair or when he takes a deep breath.  He also points to the chest pain at 1 focal area sometimes.  His wife is present.  Patient states that since being in the hospital at rest, she has not had any further episodes of chest pain.  Past Medical History:  Diagnosis Date   Chest pain 06/2018   Coronary artery disease    Hyperlipidemia    Hypertension    Obesity    Sexual dysfunction    Past Surgical History:  Procedure Laterality Date   CARDIAC CATHETERIZATION N/A 12/23/2014    Procedure: Left Heart Cath and Coronary Angiography;  Surgeon: Dixie Dials, MD;  Location: [facility];  Service: Cardiovascular;  Laterality: N/A;   CORONARY STENT INTERVENTION     KNEE SURGERY     LEFT HEART CATH AND CORONARY ANGIOGRAPHY N/A 01/04/2021   Procedure: LEFT HEART CATH AND CORONARY ANGIOGRAPHY;  Surgeon: Dixie Dials, MD;  Location: [facility];  Service: Cardiovascular;  Laterality: N/A;   Social History   Tobacco Use   Smoking status: Former    Years: 2.00    Types: Cigarettes   Smokeless tobacco: Never  Substance Use Topics   Alcohol use: No    Family History  Problem Relation Age of Onset   Other Other        Pacemaker    Marital Status: Married  ROS  Review of Systems  Cardiovascular:  Positive for chest pain and claudication. Negative for dyspnea on exertion and leg swelling.   Objective      05/21/2022    2:30 AM 05/20/2022   11:00 PM 05/20/2022    9:00 PM  Vitals with BMI  Systolic [id number]  Diastolic 73 84 78  Pulse 56 75 59    Blood pressure 114/73, pulse (!) 56, temperature 97.9 F (36.6 C), temperature source Oral, resp. rate 13, height 6' 3"$  (1.905 m), weight 117 kg, SpO2 96 %.  Physical Exam Constitutional:      Appearance: He is obese.  Neck:     Vascular:  No carotid bruit or JVD.  Cardiovascular:     Rate and Rhythm: Normal rate and regular rhythm.     Pulses: Intact distal pulses.          Femoral pulses are 2+ on the right side and 2+ on the left side.      Dorsalis pedis pulses are 0 on the right side and 2+ on the left side.       Posterior tibial pulses are 0 on the right side and 0 on the left side.     Heart sounds: Normal heart sounds. No murmur heard.    No gallop.  Pulmonary:     Effort: Pulmonary effort is normal.     Breath sounds: Normal breath sounds.  Abdominal:     General: Bowel sounds are normal.     Palpations: Abdomen is soft.  Musculoskeletal:     Right lower leg: No edema.     Left lower  leg: No edema.    Laboratory examination:   Recent Labs    05/20/22 1259  NA 135  K 3.9  CL 104  CO2 22  GLUCOSE 82  BUN 12  CREATININE 0.94  CALCIUM 9.1  GFRNONAA >60      Latest Ref Rng & Units 05/20/2022   12:59 PM 01/05/2021    2:16 AM 01/04/2021    2:30 AM  CMP  Glucose 70 - 99 mg/dL 82  117  128   BUN 8 - 23 mg/dL 12  10  8   $ Creatinine 0.61 - 1.24 mg/dL 0.94  1.09  1.01   Sodium 135 - 145 mmol/L 135  137  138   Potassium 3.5 - 5.1 mmol/L 3.9  3.8  3.5   Chloride 98 - 111 mmol/L 104  107  107   CO2 22 - 32 mmol/L 22  24  27   $ Calcium 8.9 - 10.3 mg/dL 9.1  8.8  8.8   Total Protein 6.5 - 8.1 g/dL 6.6     Total Bilirubin 0.3 - 1.2 mg/dL 1.6     Alkaline Phos 38 - 126 U/L 65     Carroll 15 - 41 U/L 34     ALT 0 - 44 U/L 27         Latest Ref Rng & Units 05/20/2022    3:30 PM 01/06/2021    1:53 AM 01/05/2021    2:16 AM  CBC  WBC 4.0 - 10.5 K/uL 5.4  5.6  4.9   Hemoglobin 13.0 - 17.0 g/dL 13.1  12.8  12.6   Hematocrit 39.0 - 52.0 % 37.2  37.9  37.7   Platelets 150 - 400 K/uL 203  190  202    Lab Results  Component Value Date   CHOL 114 01/04/2021   HDL 37 (L) 01/04/2021   LDLCALC 62 01/04/2021   TRIG 76 01/04/2021   CHOLHDL 3.1 01/04/2021    HEMOGLOBIN A1C Lab Results  Component Value Date   HGBA1C 6.2 (H) 01/05/2021   MPG 131.24 01/05/2021   No results found for: "TSH"  BNP (last 3 results) Recent Labs    05/20/22 1530  BNP 18.5   Cardiac Panel (last 3 results) Recent Labs    05/20/22 1259 05/20/22 1530  TROPONINIHS 44* 48*     Medications and allergies  No Known Allergies   Current Meds  Medication Sig   amLODipine (NORVASC) 5 MG tablet Take 1 tablet (5 mg total) by mouth daily.   aspirin  81 MG tablet Take 81 mg by mouth daily.   atorvastatin (LIPITOR) 40 MG tablet Take 1 tablet (40 mg total) by mouth daily at 6 PM. (Patient taking differently: Take 40 mg by mouth daily.)   hydrochlorothiazide (HYDRODIURIL) 12.5 MG tablet Take 1 tablet by  mouth daily.   isosorbide mononitrate (IMDUR) 30 MG 24 hr tablet Take 1 tablet (30 mg total) by mouth daily.   lisinopril (PRINIVIL,ZESTRIL) 10 MG tablet Take 1 tablet (10 mg total) by mouth daily.   nitroGLYCERIN (NITROSTAT) 0.4 MG SL tablet Place 1 tablet (0.4 mg total) under the tongue every 5 (five) minutes x 3 doses as needed for chest pain.   tamsulosin (FLOMAX) 0.4 MG CAPS capsule Take 0.4 mg by mouth daily.    Scheduled Meds:  amLODipine  5 mg Oral Daily   aspirin EC  81 mg Oral Daily   atorvastatin  80 mg Oral Daily   metoprolol tartrate  12.5 mg Oral BID   Continuous Infusions:  heparin 1,300 Units/hr (05/20/22 1848)   Radiology:   DG Chest 2 View  Result Date: 05/20/2022 CLINICAL DATA:  Intermittent chest pain radiating to the left arm EXAM: CHEST - 2 VIEW COMPARISON:  05/05/2020 FINDINGS: Heart size upper limits of normal. Mildly tortuous aorta. Both lungs are clear. The visualized skeletal structures are unremarkable. IMPRESSION: No active cardiopulmonary disease. Electronically Signed   By: Nelson Chimes M.D.   On: 05/20/2022 14:52    Cardiac Studies:   Lexiscan nuclear stress test 01/05/2021: 1. No reversible ischemia or infarction. 2. Normal left ventricular wall motion. 3. Left ventricular ejection fraction 51%  Echocardiogram 12/15/2020: 1. Left ventricular ejection fraction, by estimation, is 60 to 65%. The left ventricle has normal function. The left ventricle has no regional wall motion abnormalities. Left ventricular diastolic parameters are consistent with Grade I diastolic  dysfunction (impaired relaxation).  2. Right ventricular systolic function is normal. The right ventricular size is normal. There is normal pulmonary artery systolic pressure.  3. Left atrial size was mildly dilated.  4. Right atrial size was mildly dilated.  5. The mitral valve is degenerative. Mild mitral valve regurgitation.  6. The aortic valve is tricuspid. There is mild calcification  of the aortic valve. There is mild thickening of the aortic valve. Aortic valve regurgitation is mild. Mild aortic valve sclerosis is present, with no evidence of aortic valve stenosis.  7. The inferior vena cava is normal in size with greater than 50% respiratory variability, suggesting right atrial pressure of 3 mmHg.  Coronary angiogram 01/04/2021:   Prox LAD lesion is 20% stenosed.   Prox RCA to Mid RCA lesion is 15% stenosed.   Mid RCA lesion is 35% stenosed.   Dist RCA lesion is 20% stenosed.   1st Diag lesion is 25% stenosed.   Ost LM to Mid LM lesion is 50% stenosed.   Mid LAD lesion is 45% stenosed.   Ost Cx to Dist Cx lesion is 60% stenosed.  Cardiac catheterization 07/28/2009: LM: History of left main stenting.  SP 4.0 x 15 mm thrombosed stent in the ostium and proximal left main, successful balloon angioplasty followed by confirmation with IVUS.  EKG:  EKG 05/20/2022: Normal sinus rhythm at rate of 61 bpm, normal EKG.  No evidence of ischemia.  Assessment   1.  Unstable angina pectoris 2.  Coronary artery disease with history of 4.0 x 15 mm DES stent placed sometime in 2011, patient presenting with stent thrombosis NSTEMI on 07/28/2009 needing  angioplasty, repeat cardiac catheterization x 2 in the past revealing widely patent stent with mild in-stent restenosis and moderate disease in the native vessels. 3.  Primary hypertension 4.  Hypercholesterolemia 5.  Peripheral arterial disease with symptoms of right calf claudication.  Recommendations:   BRAIDYN ROTMAN is a 72 y.o. Male patient with coronary artery disease with left main stenting in 2011 with a focal 4.00 x 15 mm DES, primary hypertension, hypercholesterolemia, morbid obesity, remote tobacco use disorder, last cardiac catheterization 01/04/2021 revealing patent stent, presents with recurrent episodes of chest pain to the emergency room.  Patient serum troponins are fairly flat but mildly elevated.  No EKG  abnormalities, presently on IV heparin and remains chest pain-free.   Chest pain symptoms mostly indicator of musculoskeletal etiology but also has a component of angina pectoris as well.  In view of left main stenting, will proceed with cardiac catheterization.  I discussed with the patient regarding continued observation, versus invasive strategy.  Patient has already had a stress test recently in September 2022 revealing no evidence of ischemia, now presenting with chest discomfort that is nitrate responsive and also has exertional component.  Will proceed with cardiac catheterization and possible angioplasty to evaluate for progression of coronary disease and/or restenosis in the left main stent.  Patient's blood pressure is well-controlled.  He is also on >2 antianginal medications.  Lipids are being managed by high intensity statin.  LDL is at goal.  He has symptoms of claudication involving his right lower extremity and abnormal physical examination.  He frequently stops his activity due to leg cramps in his right calf.  He will need further workup and/or therapy for the same.  Schedule for cardiac catheterization, and possible angioplasty. We discussed regarding risks, benefits, alternatives to this including stress testing, CTA and continued medical therapy. Patient wants to proceed. Understands <1-2% risk of death, stroke, MI, urgent CABG, bleeding, infection, renal failure but not limited to these.    Adrian Prows, MD, [facility] 05/21/2022, 3:01 AM Office: [phone]

## 2022-05-21 NOTE — Hospital Course (Addendum)
Nicholas Carroll is a 72 y.o.male with a history of CAD with DES, HTN, HLD  who was admitted to the [facility] Medicine Teaching Service at [facility] for chest pain. His hospital course is detailed below:  Chest Pain  Unstable Angina Patient presented after a week of progressive typical chest pain at rest and with exertion. EKG did not have any ischemic changes. Troponins flattened out at 48. Cardiology was consulted and recommended heart cath due to concern for unstable angina. Patient was started on heparin gtt, continued on home amlodipine, atorvastatin, lisinopril 10 mg, aspirin 81 mg, metoprolol 6.25 BID. Heart cath on 2/13 showed Mid eccentric 40-50% ISR (Unchanged since 12/2020), LAD: Prox 20%, mid 50%, ostial diag 30% disease (Unchanged since 12/2020), Lcx: Small caliber, diffuse 40% disease (Unchanged since 12/2020), Ramus: Large vessel. Mid focal 70% stenosis (Slightly progressed since 12/2020), RCA: Mid diffuse 40%, distal 20-30% disease (Unchanged since 12/2020), LVEDP 10 mmHg LVEF 55-60%. Patient and cardiology decided to not pursue stenting and go forward with medical therapy as patient had not been consistent with medications previously. Plavix was started on 2/13.  Essential hypertension  On amlodipine and metoprolol. Continued during hospitalization  Hematuria  Patient reported bloody urine - urinalysis had trace leukocytes, few bacteria and no RBC or hgb. US scrotum showed no testicular mass but did show 4 mm epididymal cyst or spermatocele in right epididymis. Hgb remained stable here and patient was asymptomatic at discharge with stable vital signs. Complained of some blood when wiping rectum day of discharge - on exam looked like patient had small external tear.  PCP Follow-up Recommendations: Ensure follow up with cardiology Follow up with urology on episode of bloody urine and epididymal cyst on right Recommend BMP and CBC on follow up with PCP

## 2022-05-22 ENCOUNTER — Encounter (HOSPITAL_COMMUNITY): Payer: Self-pay | Admitting: Cardiology

## 2022-05-22 ENCOUNTER — Observation Stay (HOSPITAL_COMMUNITY): Payer: Medicare PPO

## 2022-05-22 ENCOUNTER — Other Ambulatory Visit (HOSPITAL_COMMUNITY): Payer: Self-pay

## 2022-05-22 DIAGNOSIS — I209 Angina pectoris, unspecified: Secondary | ICD-10-CM

## 2022-05-22 DIAGNOSIS — I25110 Atherosclerotic heart disease of native coronary artery with unstable angina pectoris: Secondary | ICD-10-CM | POA: Diagnosis not present

## 2022-05-22 DIAGNOSIS — K625 Hemorrhage of anus and rectum: Secondary | ICD-10-CM

## 2022-05-22 DIAGNOSIS — R319 Hematuria, unspecified: Secondary | ICD-10-CM

## 2022-05-22 LAB — CBC
HCT: 37.1 % — ABNORMAL LOW (ref 39.0–52.0)
Hemoglobin: 13.1 g/dL (ref 13.0–17.0)
MCH: 30.2 pg (ref 26.0–34.0)
MCHC: 35.3 g/dL (ref 30.0–36.0)
MCV: 85.5 fL (ref 80.0–100.0)
Platelets: 195 10*3/uL (ref 150–400)
RBC: 4.34 MIL/uL (ref 4.22–5.81)
RDW: 14.7 % (ref 11.5–15.5)
WBC: 5.4 10*3/uL (ref 4.0–10.5)
nRBC: 0 % (ref 0.0–0.2)

## 2022-05-22 LAB — URINALYSIS, ROUTINE W REFLEX MICROSCOPIC
Bilirubin Urine: NEGATIVE
Glucose, UA: NEGATIVE mg/dL
Hgb urine dipstick: NEGATIVE
Ketones, ur: NEGATIVE mg/dL
Nitrite: NEGATIVE
Protein, ur: NEGATIVE mg/dL
Specific Gravity, Urine: 1.012 (ref 1.005–1.030)
pH: 6 (ref 5.0–8.0)

## 2022-05-22 LAB — BASIC METABOLIC PANEL
Anion gap: 7 (ref 5–15)
BUN: 9 mg/dL (ref 8–23)
CO2: 24 mmol/L (ref 22–32)
Calcium: 8.8 mg/dL — ABNORMAL LOW (ref 8.9–10.3)
Chloride: 102 mmol/L (ref 98–111)
Creatinine, Ser: 1.02 mg/dL (ref 0.61–1.24)
GFR, Estimated: >60 mL/min (ref >60)
Glucose, Bld: 101 mg/dL — ABNORMAL HIGH (ref 70–99)
Potassium: 3.9 mmol/L (ref 3.5–5.1)
Sodium: 133 mmol/L — ABNORMAL LOW (ref 135–145)

## 2022-05-22 LAB — MAGNESIUM: Magnesium: 2 mg/dL (ref 1.7–2.4)

## 2022-05-22 MED ORDER — METOPROLOL TARTRATE 25 MG PO TABS
12.5000 mg | ORAL_TABLET | Freq: Two times a day (BID) | ORAL | 0 refills | Status: DC
  Filled 2022-05-22: qty 60, 60d supply, fill #0

## 2022-05-22 MED ORDER — ATORVASTATIN CALCIUM 80 MG PO TABS
80.0000 mg | ORAL_TABLET | Freq: Every day | ORAL | 0 refills | Status: DC
  Filled 2022-05-22: qty 30, 30d supply, fill #0

## 2022-05-22 MED ORDER — CLOPIDOGREL BISULFATE 75 MG PO TABS
75.0000 mg | ORAL_TABLET | Freq: Every day | ORAL | 0 refills | Status: DC
  Filled 2022-05-22: qty 30, 30d supply, fill #0

## 2022-05-22 MED ORDER — POLYETHYLENE GLYCOL 3350 17 GM/SCOOP PO POWD: 17.0000 g | Freq: Every day | ORAL | 0 refills | Status: AC

## 2022-05-22 NOTE — Discharge Instructions (Addendum)
Dear Nicholas Carroll,   Thank you so much for allowing [place] to be part of your care!  You were admitted to [facility] for chest pain. You had a heart cath and we are planning to manage you medically. Your scrotal ultrasound showed a small cyst versus a spermatocele-please follow up with your urologist about this. Your urine did not look infected. We will let you know about your urine culture.   POST-HOSPITAL & CARE INSTRUCTIONS Follow up with cardiology Please let PCP/Specialists know of any changes that were made.  Please see medications section of this packet for any medication changes.   DOCTOR'S APPOINTMENT & FOLLOW UP CARE INSTRUCTIONS  No future appointments.  RETURN PRECAUTIONS: Worsening chest pain, shortness of breath  Take care and be well!  [facility]  [place], [place] [address] [phone]

## 2022-05-22 NOTE — Plan of Care (Signed)

## 2022-05-22 NOTE — Assessment & Plan Note (Addendum)
Reports blood in urine today. Also some mild tenderness on scrotum. Hb 13.1. Patient spoke to his outpatient urologist. - f/u urine culture - f/u US scrotum

## 2022-05-22 NOTE — Discharge Summary (Signed)
[facility] Discharge Summary  Patient name: Nicholas Carroll Medical record number: [id number] Date of birth: 05/31/1950 Age: 72 y.o. Gender: male Date of Admission: 05/20/2022  Date of Discharge: 03/22/2023 Admitting Physician: Lowry Ram, MD  Primary Care Provider: Lin Landsman, MD Consultants: Cardiology  Indication for Hospitalization: Unstable Angina  Brief Hospital Course:  Nicholas Carroll is a 72 y.o.male with a history of CAD with DES, HTN, HLD  who was admitted to the [facility] Medicine Teaching Service at [facility] for chest pain. His hospital course is detailed below:  Chest Pain  Unstable Angina Patient presented after a week of progressive typical chest pain at rest and with exertion. EKG did not have any ischemic changes. Troponins flattened out at 48. Cardiology was consulted and recommended heart cath due to concern for unstable angina. Patient was started on heparin gtt, continued on home amlodipine, atorvastatin, lisinopril 10 mg, aspirin 81 mg, metoprolol 6.25 BID. Heart cath on 2/13 showed Mid eccentric 40-50% ISR (Unchanged since 12/2020), LAD: Prox 20%, mid 50%, ostial diag 30% disease (Unchanged since 12/2020), Lcx: Small caliber, diffuse 40% disease (Unchanged since 12/2020), Ramus: Large vessel. Mid focal 70% stenosis (Slightly progressed since 12/2020), RCA: Mid diffuse 40%, distal 20-30% disease (Unchanged since 12/2020), LVEDP 10 mmHg LVEF 55-60%. Patient and cardiology decided to not pursue stenting or CABG and to go forward with medical therapy as patient had not been consistent with medications previously. Plavix was started on 2/13.  Essential hypertension  Remained stable, discharged on amlodipine 5 mg, HCTZ 12.5 mg, Imdur 30 mg, lisinopril 10 mg.  Hematuria  Patient reported bloody urine - urinalysis had trace leukocytes, few bacteria and no RBC or hgb. US scrotum showed no testicular mass but did show 4 mm epididymal cyst or  spermatocele in right epididymis. Hgb remained stable here and patient was asymptomatic at discharge with stable vital signs. Complained of some blood when wiping rectum day of discharge - on exam looked like patient had small external tear.  PCP Follow-up Recommendations: Ensure follow up with cardiology Follow up with urology on episode of bloody urine and epididymal cyst on right Recommend BMP and CBC on follow up with PCP 4. Follow up urine culture 5. Follow up BP at follow up as some home medications were restarted at discharge   Disposition: Home  Discharge Condition: Stable  Discharge Exam: Per Dr. Edd Arbour: Physical Exam: General: in no acute distress and well-appearing HEENT: normocephalic and atraumatic Respiratory: non-labored breathing and on RA Extremities: moving all extremities spontaneously Gastrointestinal: non-tender and non-distended. Superficial small tear near anal orifice Cardiovascular: regular rate, bordering bradycardic Urogenital: mild R sided tenderness on scrotal palpation  Significant Procedures: Left Heart Cath 05/21/2022  Significant Labs and Imaging:  Recent Labs  Lab 05/20/22 1530 05/21/22 0432 05/22/22 0036  WBC 5.4 4.3 5.4  HGB 13.1 13.1 13.1  HCT 37.2* 37.6* 37.1*  PLT 203 201 195   Recent Labs  Lab 05/21/22 0432 05/22/22 0036  NA 136 133*  K 3.1* 3.9  CL 103 102  CO2 25 24  GLUCOSE 96 101*  BUN 10 9  CREATININE 0.91 1.02  CALCIUM 8.9 8.8*  MG  --  2.0    Results/Tests Pending at Time of Discharge: Urine Culture  Discharge Medications:  Allergies as of 05/22/2022   No Known Allergies      Medication List     TAKE these medications    amLODipine 5 MG tablet Commonly known as: NORVASC Take  1 tablet (5 mg total) by mouth daily.   aspirin 81 MG tablet Take 81 mg by mouth daily.   atorvastatin 80 MG tablet Commonly known as: LIPITOR Take 1 tablet (80 mg total) by mouth daily. Start taking on: May 23, 2022 What  changed:  medication strength how much to take when to take this   clopidogrel 75 MG tablet Commonly known as: PLAVIX Take 1 tablet (75 mg total) by mouth daily. Start taking on: May 23, 2022   hydrochlorothiazide 12.5 MG tablet Commonly known as: HYDRODIURIL Take 1 tablet by mouth daily.   isosorbide mononitrate 30 MG 24 hr tablet Commonly known as: IMDUR Take 1 tablet (30 mg total) by mouth daily.   lisinopril 10 MG tablet Commonly known as: ZESTRIL Take 1 tablet (10 mg total) by mouth daily.   metoprolol tartrate 25 MG tablet Commonly known as: LOPRESSOR Take 1/2 tablet (12.5 mg total) by mouth 2 (two) times daily.   nitroGLYCERIN 0.4 MG SL tablet Commonly known as: NITROSTAT Place 1 tablet (0.4 mg total) under the tongue every 5 (five) minutes x 3 doses as needed for chest pain.   polyethylene glycol powder 17 GM/SCOOP powder Commonly known as: GLYCOLAX/MIRALAX Take 17 g by mouth daily.   tamsulosin 0.4 MG Caps capsule Commonly known as: FLOMAX Take 0.4 mg by mouth daily.        Discharge Instructions: Please refer to Patient Instructions section of EMR for full details.  Patient was counseled important signs and symptoms that should prompt return to medical care, changes in medications, dietary instructions, activity restrictions, and follow up appointments.   Follow-Up Appointments:  Follow-up Information     Lin Landsman, MD Follow up.   Specialty: Family Medicine Contact information: [place] [address] [phone]                 Gerrit Heck, MD 05/22/2022, 1:26 PM PGY-2, [place]

## 2022-05-22 NOTE — TOC Transition Note (Signed)
Transition of Care [facility]) - CM/SW Discharge Note   Patient Details  Name: Nicholas Carroll MRN: [id number] Date of Birth: [birth year]  Transition of Care [facility]) CM/SW Contact:  Zenon Mayo, RN Phone Number: 05/22/2022, 10:12 AM   Clinical Narrative:    Patient is for poss dc today, wife at bedside and will transport him home.  He is indep. He has no needs.          Patient Goals and CMS Choice      Discharge Placement                         Discharge Plan and Services Additional resources added to the After Visit Summary for                                       Social Determinants of Health (SDOH) Interventions SDOH Screenings   Food Insecurity: No Food Insecurity (05/21/2022)  Housing: Low Risk  (05/21/2022)  Transportation Needs: No Transportation Needs (05/21/2022)  Utilities: Not At Risk (05/21/2022)  Tobacco Use: Medium Risk (05/22/2022)     Readmission Risk Interventions     No data to display

## 2022-05-22 NOTE — Care Management Obs Status (Signed)
[place] NOTIFICATION   Patient Details  Name: Nicholas Carroll MRN: [id number] Date of Birth: Mar 17, 1951   Medicare Observation Status Notification Given:  Yes    Zenon Mayo, RN 05/22/2022, 10:11 AM

## 2022-05-22 NOTE — Progress Notes (Signed)
     Daily Progress Note Intern Pager: [phone]  Patient name: Nicholas Carroll Medical record number: [id number] Date of birth: 13-Dec-1950 Age: 72 y.o. Gender: male  Primary Care Provider: Lin Landsman, MD Consultants: Cardiology Code Status: Full Code  Pt Overview and Major Events to Date:  2/12: Admitted 2/13: s/p heart cath  Assessment and Plan: JACSON RAPAPORT is a 72 y.o. male presenting with chest pain. Past medical history includes CAD with previous DES, HTN, HLD.  * Angina pectoris ([place]) Denies chest pain today. S/p cath 2/13 - Cardiology signed off - continue metoprolol - Continue home atorvastatin, aspirin  - Cardiac telemetry  - Vitals per floor  - PT eval and treat  Hematuria Reports blood in urine today. Also some mild tenderness on scrotum. Hb 13.1. Patient spoke to his outpatient urologist. - f/u urine culture - f/u US scrotum  Bright red blood per rectum Patient reports seeing blood when he was wiping this morning. There is a notable superficial tear near anal orifice on exam. Recently received heparin, plavix. Hb is wnl. Colonoscopy in 2020. - CTM  Essential hypertension On amlodipine 5 mg daily and metoprolol 12.5 BID  - continue amlodipine  - Hold metoprolol for HR < 60, or SBP < 100    FEN/GI: NPO PPx: SCDs Dispo: home  Subjective:  Endorses hematuria this morning as well as BRBPR. He spoke to his outpatient urologist who recommended we consult inpatient urology. Otherwise feels well.  Objective: Temp:  [97.5 F (36.4 C)-99.4 F (37.4 C)] 97.9 F (36.6 C) (02/14 1116) Pulse Rate:  [52-68] 52 (02/14 1116) Resp:  [15-28] 19 (02/14 1116) BP: (102-147)/(62-96) 147/78 (02/14 1116) SpO2:  [94 %-99 %] 98 % (02/14 1116) Weight:  [117.8 kg-117.9 kg] 117.8 kg (02/14 0347) Physical Exam: General: in no acute distress and well-appearing HEENT: normocephalic and atraumatic Respiratory: non-labored breathing and on RA Extremities: moving all  extremities spontaneously Gastrointestinal: non-tender and non-distended. Superficial tear near anal orifice Cardiovascular: regular rate, bordering bradycardic Urogenital: pain on scrotal palpation  Laboratory: Most recent CBC Lab Results  Component Value Date   WBC 5.4 05/22/2022   HGB 13.1 05/22/2022   HCT 37.1 (L) 05/22/2022   MCV 85.5 05/22/2022   PLT 195 05/22/2022   Most recent BMP    Latest Ref Rng & Units 05/22/2022   12:36 AM  BMP  Glucose 70 - 99 mg/dL 101   BUN 8 - 23 mg/dL 9   Creatinine 0.61 - 1.24 mg/dL 1.02   Sodium 135 - 145 mmol/L 133   Potassium 3.5 - 5.1 mmol/L 3.9   Chloride 98 - 111 mmol/L 102   CO2 22 - 32 mmol/L 24   Calcium 8.9 - 10.3 mg/dL 8.8     Camelia Phenes, MD 05/22/2022, 11:58 AM  PGY-1, [place] Intern pager: [phone], text pages welcome Secure chat group [place]

## 2022-05-22 NOTE — Assessment & Plan Note (Addendum)
Patient reports seeing blood when he was wiping this morning. There is a notable superficial tear near anal orifice on exam. Recently received heparin, plavix. Hb is wnl. Colonoscopy in 2020. - CTM

## 2022-05-22 NOTE — TOC Progression Note (Signed)
Transition of Care [facility]) - Progression Note    Patient Details  Name: Nicholas Carroll MRN: [id number] Date of Birth: January 22, 1951  Transition of Care [facility]) CM/SW Contact  Zenon Mayo, RN Phone Number: 05/22/2022, 7:27 AM  Clinical Narrative:    From home with spouse, presents with chest pain and sob, s/p heart cath .  TOC following.        Expected Discharge Plan and Services                                               Social Determinants of Health (SDOH) Interventions SDOH Screenings   Food Insecurity: No Food Insecurity (05/21/2022)  Housing: Low Risk  (05/21/2022)  Transportation Needs: No Transportation Needs (05/21/2022)  Utilities: Not At Risk (05/21/2022)  Tobacco Use: Medium Risk (05/22/2022)    Readmission Risk Interventions     No data to display

## 2022-05-22 NOTE — Discharge Summary (Incomplete)
[facility] Discharge Summary  Patient name: Nicholas Carroll Medical record number: [id number] Date of birth: 03-29-1951 Age: 72 y.o. Gender: male Date of Admission: 05/20/2022  Date of Discharge: 05/22/2022  Admitting Physician: Lowry Ram, MD  Primary Care Provider: Lin Landsman, MD Consultants: cardiology  Indication for Hospitalization: chest pain  Brief Hospital Course:  Nicholas Carroll is a 72 y.o.male with a history of CAD with DES, HTN, HLD  who was admitted to the [facility] Medicine Teaching Service at [facility] for chest pain. His hospital course is detailed below:  Chest Pain  Unstable Angina Patient presented after a week of progressive typical chest pain at rest and with exertion. EKG did not have any ischemic changes. Troponins flattened out at 48. Cardiology was consulted and recommended heart cath due to concern for unstable angina. Patient was started on heparin gtt, continued on home amlodipine, atorvastatin, lisinopril 10 mg, aspirin 81 mg, metoprolol 6.25 BID. Heart cath on 2/13 showed Mid eccentric 40-50% ISR (Unchanged since 12/2020), LAD: Prox 20%, mid 50%, ostial diag 30% disease (Unchanged since 12/2020), Lcx: Small caliber, diffuse 40% disease (Unchanged since 12/2020), Ramus: Large vessel. Mid focal 70% stenosis (Slightly progressed since 12/2020), RCA: Mid diffuse 40%, distal 20-30% disease (Unchanged since 12/2020), LVEDP 10 mmHg LVEF 55-60%. Patient and cardiology decided to not pursue stenting and go forward with medical therapy as patient had not been consistent with medications previously. Plavix was started on 2/13.  Essential hypertension  On amlodipine and metoprolol. Continued during hospitalization  Hematuria BRBPR Patient reported bloody urine and BRBPR - urinalysis pending, US scrotum shows ***  PCP Follow-up Recommendations: Regular follow-up   Discharge Diagnoses/Problem List:  * Angina pectoris ([place]) Denies chest pain  today. S/p cath 2/13 - Cardiology signed off - continue metoprolol - Continue home atorvastatin, aspirin  - Cardiac telemetry  - Vitals per floor  - PT eval and treat  Hematuria Reports blood in urine today. Also some mild tenderness on scrotum. Hb 13.1. Patient spoke to his outpatient urologist. - f/u urine culture - f/u US scrotum  Bright red blood per rectum Patient reports seeing blood when he was wiping this morning. There is a notable superficial tear near anal orifice on exam. Recently received heparin, plavix. Hb is wnl. Colonoscopy in 2020. - CTM  Essential hypertension On amlodipine 5 mg daily and metoprolol 12.5 BID  - continue amlodipine  - Hold metoprolol for HR < 60, or SBP < 100      ***  Disposition: ***  Discharge Condition: ***  Discharge Exam: ***  Issues for Follow Up:  1. ***  Significant Procedures: ***  Significant Labs and Imaging:  Recent Labs  Lab 05/20/22 1530 05/21/22 0432 05/22/22 0036  WBC 5.4 4.3 5.4  HGB 13.1 13.1 13.1  HCT 37.2* 37.6* 37.1*  PLT 203 201 195   Recent Labs  Lab 05/20/22 1259 05/21/22 0432 05/22/22 0036  NA 135 136 133*  K 3.9 3.1* 3.9  CL 104 103 102  CO2 22 25 24  $ GLUCOSE 82 96 101*  BUN 12 10 9  $ CREATININE 0.94 0.91 1.02  CALCIUM 9.1 8.9 8.8*  MG  --   --  2.0  ALKPHOS 65  --   --   AST 34  --   --   ALT 27  --   --   ALBUMIN 3.5  --   --     ***  Results/Tests Pending at Time of Discharge: ***  Discharge Medications:  Allergies as of 05/22/2022   No Known Allergies      Medication List     TAKE these medications    amLODipine 5 MG tablet Commonly known as: NORVASC Take 1 tablet (5 mg total) by mouth daily.   aspirin 81 MG tablet Take 81 mg by mouth daily.   atorvastatin 80 MG tablet Commonly known as: LIPITOR Take 1 tablet (80 mg total) by mouth daily. Start taking on: May 23, 2022 What changed:  medication strength how much to take when to take this   clopidogrel  75 MG tablet Commonly known as: PLAVIX Take 1 tablet (75 mg total) by mouth daily. Start taking on: May 23, 2022   hydrochlorothiazide 12.5 MG tablet Commonly known as: HYDRODIURIL Take 1 tablet by mouth daily.   isosorbide mononitrate 30 MG 24 hr tablet Commonly known as: IMDUR Take 1 tablet (30 mg total) by mouth daily.   lisinopril 10 MG tablet Commonly known as: ZESTRIL Take 1 tablet (10 mg total) by mouth daily.   metoprolol tartrate 25 MG tablet Commonly known as: LOPRESSOR Take 1/2 tablet (12.5 mg total) by mouth 2 (two) times daily.   nitroGLYCERIN 0.4 MG SL tablet Commonly known as: NITROSTAT Place 1 tablet (0.4 mg total) under the tongue every 5 (five) minutes x 3 doses as needed for chest pain.   polyethylene glycol powder 17 GM/SCOOP powder Commonly known as: GLYCOLAX/MIRALAX Take 17 g by mouth daily.   tamsulosin 0.4 MG Caps capsule Commonly known as: FLOMAX Take 0.4 mg by mouth daily.        Discharge Instructions: Please refer to Patient Instructions section of EMR for full details.  Patient was counseled important signs and symptoms that should prompt return to medical care, changes in medications, dietary instructions, activity restrictions, and follow up appointments.   Follow-Up Appointments:  Follow-up Information     Lin Landsman, MD Follow up.   Specialty: Family Medicine Contact information: [place] [address] [phone]                 Camelia Phenes, MD 05/22/2022, 11:49 AM PGY-1, [place]

## 2022-05-22 NOTE — Progress Notes (Signed)
Subjective:  Patient denies any chest pain, shortness of breath.  Objective:  Vital Signs in the last 24 hours: Temp:  [97.5 F (36.4 C)-99.4 F (37.4 C)] 97.9 F (36.6 C) (02/14 0716) Pulse Rate:  [52-68] 60 (02/14 0716) Resp:  [13-28] 19 (02/14 0716) BP: (102-146)/(62-96) 138/71 (02/14 0716) SpO2:  [94 %-99 %] 97 % (02/14 0716) Weight:  [117.8 kg-117.9 kg] 117.8 kg (02/14 0347)  Intake/Output from previous day: 02/13 0701 - 02/14 0700 In: 560.5 [I.V.:560.5] Out: 500 [Urine:500] Intake/Output from this shift: Total I/O In: 494 [P.O.:494] Out: -   Physical Exam: Neck: no adenopathy, no carotid bruit, no JVD, and supple, symmetrical, trachea midline Lungs: clear to auscultation bilaterally Heart: regular rate and rhythm, S1, S2 normal, and 2/6 systolic murmur noted Abdomen: soft, non-tender; bowel sounds normal; no masses,  no organomegaly Extremities: extremities normal, atraumatic, no cyanosis or edema and right radial cath site okay  Lab Results: Recent Labs    05/21/22 0432 05/22/22 0036  WBC 4.3 5.4  HGB 13.1 13.1  PLT 201 195   Recent Labs    05/21/22 0432 05/22/22 0036  NA 136 133*  K 3.1* 3.9  CL 103 102  CO2 25 24  GLUCOSE 96 101*  BUN 10 9  CREATININE 0.91 1.02   No results for input(s): "TROPONINI" in the last 72 hours.  Invalid input(s): "CK", "MB" Hepatic Function Panel Recent Labs    05/20/22 1259  PROT 6.6  ALBUMIN 3.5  AST 34  ALT 27  ALKPHOS 65  BILITOT 1.6*   No results for input(s): "CHOL" in the last 72 hours. No results for input(s): "PROTIME" in the last 72 hours.  Imaging: Imaging results have been reviewed and CARDIAC CATHETERIZATION  Result Date: 05/21/2022 Images from the original result were not included. LM: Mid eccentric 40-50% ISR (Unchanged since 12/2020) LAD: Prox 20%, mid 50%, ostial diag 30% disease (Unchanged since 12/2020) Lcx: Small caliber, diffuse 40% disease (Unchanged since 12/2020) Ramus: Large vessel. Mid  focal 70% stenosis (Slightly progressed since 12/2020) RCA: Mid diffuse 40%, distal 20-30% disease (Unchanged since 12/2020) LVEDP 10 mmHg LVEF 55-60% Minimally elevated and flat troponin similar to 12/2020. He does have exertional angina symptoms. However, he is not compliant with medical therapy, including Aspirin. If symptoms persist in spite of aggressive medical therapy, we could consider revascularization. Given the left main stenosis is upper end of moderate, but stable, we could consider PCI to ramus alone. If LM disease progresses, could consider heart team discussion for CABG vs PCI. Nicholas Mormon, MD Pager: [phone] Office: [phone]  DG Chest 2 View  Result Date: 05/20/2022 CLINICAL DATA:  Intermittent chest pain radiating to the left arm EXAM: CHEST - 2 VIEW COMPARISON:  05/05/2020 FINDINGS: Heart size upper limits of normal. Mildly tortuous aorta. Both lungs are clear. The visualized skeletal structures are unremarkable. IMPRESSION: No active cardiopulmonary disease. Electronically Signed   By: Nicholas Carroll M.D.   On: 05/20/2022 14:52    Cardiac Studies:  Assessment/Plan:  Acute coronary syndrome status post left cardiac catheterization Coronary artery disease history of unprotected left main stenting in April 2011 Hypertensive heart disease Hyperlipidemia Morbid obesity Erectile dysfunction Remote tobacco abuse Benign hypertrophy of prostate Status post hypokalemia Plan Okay to discharge from cardiac point of view Follow-up with Dr. Doylene Carroll in 1 to 2 weeks Postcardiac cath instructions  LOS: 0 days    Nicholas Carroll 05/22/2022, 8:32 AM

## 2022-05-22 NOTE — Evaluation (Signed)
Physical Therapy Evaluation/DC Patient Details Name: CHAUNCE ORLOFF MRN: [id number] DOB: 03/11/1951 Today's Date: 05/22/2022  History of Present Illness  NYCERE RENY is a 72 y.o. male admitted 2/12 presenting with chest pain. PMH pertinent for CAD and HTN.  Clinical Impression  Pt tolerated treatment well. Pt was able to ambulate in hallway and navigate stairs independently. Pt has no further acute PT needs.        Recommendations for follow up therapy are one component of a multi-disciplinary discharge planning process, led by the attending physician.  Recommendations may be updated based on patient status, additional functional criteria and insurance authorization.  Follow Up Recommendations No PT follow up      Assistance Recommended at Discharge None  Patient can return home with the following       Equipment Recommendations None recommended by PT  Recommendations for Other Services       Functional Status Assessment Patient has not had a recent decline in their functional status     Precautions / Restrictions Precautions Precautions: None Restrictions Weight Bearing Restrictions: No      Mobility  Bed Mobility Overal bed mobility: Independent                  Transfers Overall transfer level: Independent Equipment used: None                    Ambulation/Gait Ambulation/Gait assistance: Modified independent (Device/Increase time) Gait Distance (Feet): 500 Feet Assistive device: None Gait Pattern/deviations: Wide base of support, Step-through pattern          Stairs Stairs: Yes Stairs assistance: Supervision Stair Management: One rail Left, Step to pattern Number of Stairs: 10    Wheelchair Mobility    Modified Rankin (Stroke Patients Only)       Balance Overall balance assessment: Independent                               Standardized Balance Assessment Standardized Balance Assessment : Dynamic Gait  Index   Dynamic Gait Index Level Surface: Normal Change in Gait Speed: Normal Gait with Horizontal Head Turns: Normal Gait with Vertical Head Turns: Normal Gait and Pivot Turn: Normal Step Over Obstacle: Normal Step Around Obstacles: Normal Steps: Normal Total Score: 24       Pertinent Vitals/Pain  VSS. Pt denies pain    Home Living Family/patient expects to be discharged to:: Private residence Living Arrangements: Spouse/significant other;Other relatives (daughter) Available Help at Discharge: Family;Available PRN/intermittently Type of Home: House Home Access: Stairs to enter Entrance Stairs-Rails: Right;Left;Can reach both Entrance Stairs-Number of Steps: 10 Alternate Level Stairs-Number of Steps: 7 Home Layout: Multi-level Home Equipment: None Additional Comments: Retired and still working, full time, bus attendant    Prior Function Prior Level of Function : Independent/Modified Independent;Working/employed;Driving                     Hand Dominance   Dominant Hand: Right    Extremity/Trunk Assessment   Upper Extremity Assessment Upper Extremity Assessment: Overall WFL for tasks assessed    Lower Extremity Assessment Lower Extremity Assessment: Overall WFL for tasks assessed       Communication   Communication: No difficulties  Cognition     Overall Cognitive Status: Within Functional Limits for tasks assessed  General Comments      Exercises     Assessment/Plan    PT Assessment Patient does not need any further PT services  PT Problem List         PT Treatment Interventions      PT Goals (Current goals can be found in the Care Plan section)  Acute Rehab PT Goals PT Goal Formulation: All assessment and education complete, DC therapy    Frequency       Co-evaluation               AM-PAC PT "6 Clicks" Mobility  Outcome Measure Help needed turning from your back  to your side while in a flat bed without using bedrails?: None Help needed moving from lying on your back to sitting on the side of a flat bed without using bedrails?: None Help needed moving to and from a bed to a chair (including a wheelchair)?: None Help needed standing up from a chair using your arms (e.g., wheelchair or bedside chair)?: None Help needed to walk in hospital room?: None Help needed climbing 3-5 steps with a railing? : None 6 Click Score: 24    End of Session Equipment Utilized During Treatment: Gait belt Activity Tolerance: Patient tolerated treatment well Patient left: in bed;with nursing/sitter in room;with family/visitor present Nurse Communication: Mobility status      Time: 0912-0935 PT Time Calculation (min) (ACUTE ONLY): 23 min   Charges:   PT Evaluation $PT Eval Low Complexity: 1 Low PT Treatments $Gait Training: 8-22 mins        Shelby Mattocks, PT, DPT Acute Rehab Services [phone]  Viann Shove 05/22/2022, 10:59 AM

## 2022-05-23 LAB — LIPOPROTEIN A (LPA): Lipoprotein (a): 86.7 nmol/L — ABNORMAL HIGH (ref <75.0)

## 2022-06-25 ENCOUNTER — Ambulatory Visit: Payer: Medicare PPO | Admitting: Internal Medicine

## 2022-06-25 ENCOUNTER — Encounter: Payer: Self-pay | Admitting: Internal Medicine

## 2022-06-25 VITALS — BP 127/67 | HR 74 | Ht 75.0 in | Wt 251.0 lb

## 2022-06-25 DIAGNOSIS — I10 Essential (primary) hypertension: Secondary | ICD-10-CM

## 2022-06-25 DIAGNOSIS — E782 Mixed hyperlipidemia: Secondary | ICD-10-CM

## 2022-06-25 DIAGNOSIS — I2510 Atherosclerotic heart disease of native coronary artery without angina pectoris: Secondary | ICD-10-CM

## 2022-06-25 DIAGNOSIS — R079 Chest pain, unspecified: Secondary | ICD-10-CM

## 2022-06-25 NOTE — Progress Notes (Unsigned)
Primary Physician/Referring:  Lin Landsman, MD  Patient ID: Nicholas Carroll, male    DOB: 06-03-1950, 72 y.o.   MRN: [id number]  Chief Complaint  Patient presents with   Chest Pain   New Patient (Initial Visit)   HPI:    Nicholas Carroll  is a 72 y.o. male with CAD, HTN, and HLD who is here to establish care with cardiology.   Past Medical History:  Diagnosis Date   Chest pain 06/2018   Coronary artery disease    Hyperlipidemia    Hypertension    Obesity    Sexual dysfunction    Past Surgical History:  Procedure Laterality Date   CARDIAC CATHETERIZATION N/A 12/23/2014   Procedure: Left Heart Cath and Coronary Angiography;  Surgeon: Dixie Dials, MD;  Location: [facility];  Service: Cardiovascular;  Laterality: N/A;   CORONARY STENT INTERVENTION     KNEE SURGERY     LEFT HEART CATH AND CORONARY ANGIOGRAPHY N/A 01/04/2021   Procedure: LEFT HEART CATH AND CORONARY ANGIOGRAPHY;  Surgeon: Dixie Dials, MD;  Location: [facility];  Service: Cardiovascular;  Laterality: N/A;   LEFT HEART CATH AND CORONARY ANGIOGRAPHY N/A 05/21/2022   Procedure: LEFT HEART CATH AND CORONARY ANGIOGRAPHY;  Surgeon: [name], MD;  Location: [facility];  Service: Cardiovascular;  Laterality: N/A;   Family History  Problem Relation Age of Onset   Brain cancer Father    Other Other        Pacemaker    Social History   Tobacco Use   Smoking status: Former    Years: 2    Types: Cigarettes   Smokeless tobacco: Never  Substance Use Topics   Alcohol use: No   Marital Status: Married  ROS  Review of Systems  Cardiovascular:  Negative for chest pain, dyspnea on exertion, leg swelling, orthopnea and palpitations.   Objective  Blood pressure 127/67, pulse 74, height 6\' 3"  (1.905 m), weight 251 lb (113.9 kg), SpO2 98 %. Body mass index is 31.37 kg/m.     06/25/2022   10:33 AM 05/22/2022   11:16 AM 05/22/2022    7:16 AM  Vitals with BMI  Height 6\' 3"     Weight  251 lbs    BMI [id number]    Systolic [id number]  Diastolic 67 78 71  Pulse 74 52 60     Physical Exam Vitals reviewed.  HENT:     Head: Normocephalic and atraumatic.  Neck:     Vascular: No carotid bruit.  Cardiovascular:     Rate and Rhythm: Normal rate and regular rhythm.     Heart sounds: Normal heart sounds. No murmur heard. Pulmonary:     Effort: Pulmonary effort is normal.     Breath sounds: Normal breath sounds.  Abdominal:     General: Bowel sounds are normal.  Musculoskeletal:     Right lower leg: No edema.     Left lower leg: No edema.  Skin:    General: Skin is warm and dry.  Neurological:     Mental Status: He is alert.     Medications and allergies  No Known Allergies   Medication list after today's encounter   Current Outpatient Medications:    amLODipine (NORVASC) 5 MG tablet, Take 1 tablet (5 mg total) by mouth daily., Disp: 30 tablet, Rfl: 3   aspirin 81 MG tablet, Take 81 mg by mouth daily., Disp: , Rfl:    atorvastatin (  LIPITOR) 80 MG tablet, Take 1 tablet (80 mg total) by mouth daily., Disp: 30 tablet, Rfl: 0   clopidogrel (PLAVIX) 75 MG tablet, Take 1 tablet (75 mg total) by mouth daily., Disp: 30 tablet, Rfl: 0   hydrochlorothiazide (HYDRODIURIL) 12.5 MG tablet, Take 1 tablet by mouth daily., Disp: , Rfl:    isosorbide mononitrate (IMDUR) 30 MG 24 hr tablet, Take 1 tablet (30 mg total) by mouth daily., Disp: , Rfl:    lisinopril (PRINIVIL,ZESTRIL) 10 MG tablet, Take 1 tablet (10 mg total) by mouth daily., Disp: 30 tablet, Rfl: 0   metoprolol tartrate (LOPRESSOR) 25 MG tablet, Take 1/2 tablet (12.5 mg total) by mouth 2 (two) times daily., Disp: 60 tablet, Rfl: 0   nitroGLYCERIN (NITROSTAT) 0.4 MG SL tablet, Place 1 tablet (0.4 mg total) under the tongue every 5 (five) minutes x 3 doses as needed for chest pain., Disp: 25 tablet, Rfl: 1   polyethylene glycol powder (GLYCOLAX/MIRALAX) 17 GM/SCOOP powder, Take 17 g by mouth daily., Disp: 500 g, Rfl: 0    tamsulosin (FLOMAX) 0.4 MG CAPS capsule, Take 0.4 mg by mouth daily., Disp: , Rfl:    torsemide (DEMADEX) 20 MG tablet, Take 20 mg by mouth every Monday, Wednesday, and Friday., Disp: , Rfl:   Laboratory examination:   Lab Results  Component Value Date   NA 133 (L) 05/22/2022   K 3.9 05/22/2022   CO2 24 05/22/2022   GLUCOSE 101 (H) 05/22/2022   BUN 9 05/22/2022   CREATININE 1.02 05/22/2022   CALCIUM 8.8 (L) 05/22/2022   GFRNONAA >60 05/22/2022       Latest Ref Rng & Units 05/22/2022   12:36 AM 05/21/2022    4:32 AM 05/20/2022   12:59 PM  CMP  Glucose 70 - 99 mg/dL 101  96  82   BUN 8 - 23 mg/dL 9  10  12    Creatinine 0.61 - 1.24 mg/dL 1.02  0.91  0.94   Sodium 135 - 145 mmol/L 133  136  135   Potassium 3.5 - 5.1 mmol/L 3.9  3.1  3.9   Chloride 98 - 111 mmol/L 102  103  104   CO2 22 - 32 mmol/L 24  25  22    Calcium 8.9 - 10.3 mg/dL 8.8  8.9  9.1   Total Protein 6.5 - 8.1 g/dL   6.6   Total Bilirubin 0.3 - 1.2 mg/dL   1.6   Alkaline Phos 38 - 126 U/L   65   AST 15 - 41 U/L   34   ALT 0 - 44 U/L   27       Latest Ref Rng & Units 05/22/2022   12:36 AM 05/21/2022    4:32 AM 05/20/2022    3:30 PM  CBC  WBC 4.0 - 10.5 K/uL 5.4  4.3  5.4   Hemoglobin 13.0 - 17.0 g/dL 13.1  13.1  13.1   Hematocrit 39.0 - 52.0 % 37.1  37.6  37.2   Platelets 150 - 400 K/uL 195  201  203     Lipid Panel No results for input(s): "CHOL", "TRIG", "LDLCALC", "VLDL", "HDL", "CHOLHDL", "LDLDIRECT" in the last 8760 hours.  HEMOGLOBIN A1C Lab Results  Component Value Date   HGBA1C 6.2 (H) 01/05/2021   MPG 131.24 01/05/2021   TSH No results for input(s): "TSH" in the last 8760 hours.  External labs:     Radiology:    Cardiac Studies:   05/21/2022 LHC LM:  Mid eccentric 40-50% ISR (Unchanged since 12/2020) LAD: Prox 20%, mid 50%, ostial diag 30% disease (Unchanged since 12/2020) Lcx: Small caliber, diffuse 40% disease (Unchanged since 12/2020) Ramus: Large vessel. Mid focal 70% stenosis  (Slightly progressed since 12/2020) RCA: Mid diffuse 40%, distal 20-30% disease (Unchanged since 12/2020) LVEDP 10 mmHg LVEF 55-60% Minimally elevated and flat troponin similar to 12/2020. He does have exertional angina symptoms. However, he is not compliant with medical therapy, including Aspirin. If symptoms persist in spite of aggressive medical therapy, we could consider revascularization. Given the left main stenosis is upper end of moderate, but stable, we could consider PCI to ramus alone. If LM disease progresses, could consider heart team discussion for CABG vs PCI.    01/04/2021 ECHO: 1. Left ventricular ejection fraction, by estimation, is 60 to 65%. The  left ventricle has normal function. The left ventricle has no regional  wall motion abnormalities. Left ventricular diastolic parameters are  consistent with Grade I diastolic  dysfunction (impaired relaxation).   2. Right ventricular systolic function is normal. The right ventricular  size is normal. There is normal pulmonary artery systolic pressure.   3. Left atrial size was mildly dilated.   4. Right atrial size was mildly dilated.   5. The mitral valve is degenerative. Mild mitral valve regurgitation.   6. The aortic valve is tricuspid. There is mild calcification of the  aortic valve. There is mild thickening of the aortic valve. Aortic valve  regurgitation is mild. Mild aortic valve sclerosis is present, with no  evidence of aortic valve stenosis.   7. The inferior vena cava is normal in size with greater than 50%  respiratory variability, suggesting right atrial pressure of 3 mmHg.    01/05/2021 NUCLEAR STRESS: 1. No reversible ischemia or infarction. 2. Normal left ventricular wall motion. 3. Left ventricular ejection fraction 51% 4. Non invasive risk stratification*: Low   EKG:   06/25/2022: Sinus Bradycardia, rate 58 bpm. Frequent PACs in bigeminy pattern.  Assessment     ICD-10-CM   1. Chest pain of  uncertain etiology  [id number] EKG 12-Lead    2. Coronary artery disease involving native coronary artery of native heart without angina pectoris  I25.10     3. Essential hypertension  I10     4. Mixed hyperlipidemia  E78.2        Orders Placed This Encounter  Procedures   EKG 12-Lead    No orders of the defined types were placed in this encounter.   There are no discontinued medications.   Recommendations:   Nicholas Carroll is a 72 y.o.  male with CAD, HTN, and HLD  Chest pain of uncertain etiology Patient was having unstable angina in February, underwent LHC which showed no significant change compared to 2022  Coronary artery disease involving native coronary artery of native heart without angina pectoris Patient has known CAD however in the past he has been non-compliant with medications and follow-up Today, he endorses compliance with all of his medications and he is actually feeling much better He is not experiencing any anginal symptoms on this current medication regiment If he develops unstable angina again, will consider PCI now that he is medically optimized   Essential hypertension Continue current cardiac medications. Encourage low-sodium diet, less than 2000 mg daily. BP very well controlled on current regiment Follow-up in 3 months or sooner if needed.   Mixed hyperlipidemia Continue lipitor     Floydene Flock, DO, [facility]  06/25/2022, 1:18 PM  Office: [phone] Pager: [phone]

## 2022-07-11 ENCOUNTER — Encounter (HOSPITAL_BASED_OUTPATIENT_CLINIC_OR_DEPARTMENT_OTHER): Payer: Self-pay | Admitting: Emergency Medicine

## 2022-07-11 ENCOUNTER — Other Ambulatory Visit: Payer: Self-pay

## 2022-07-11 ENCOUNTER — Other Ambulatory Visit (HOSPITAL_BASED_OUTPATIENT_CLINIC_OR_DEPARTMENT_OTHER): Payer: Self-pay

## 2022-07-11 ENCOUNTER — Emergency Department (HOSPITAL_BASED_OUTPATIENT_CLINIC_OR_DEPARTMENT_OTHER)
Admission: EM | Admit: 2022-07-11 | Discharge: 2022-07-11 | Disposition: A | Discharge status: Home / Self Care | Payer: Medicare PPO | Attending: Emergency Medicine | Admitting: Emergency Medicine

## 2022-07-11 ENCOUNTER — Emergency Department (HOSPITAL_BASED_OUTPATIENT_CLINIC_OR_DEPARTMENT_OTHER): Payer: Medicare PPO

## 2022-07-11 DIAGNOSIS — J029 Acute pharyngitis, unspecified: Secondary | ICD-10-CM | POA: Diagnosis present

## 2022-07-11 DIAGNOSIS — Z7982 Long term (current) use of aspirin: Secondary | ICD-10-CM | POA: Diagnosis not present

## 2022-07-11 DIAGNOSIS — J039 Acute tonsillitis, unspecified: Secondary | ICD-10-CM | POA: Insufficient documentation

## 2022-07-11 DIAGNOSIS — Z7902 Long term (current) use of antithrombotics/antiplatelets: Secondary | ICD-10-CM | POA: Diagnosis not present

## 2022-07-11 LAB — CBC WITH DIFFERENTIAL/PLATELET
Abs Immature Granulocytes: 0.02 10*3/uL (ref 0.00–0.07)
Basophils Absolute: 0 10*3/uL (ref 0.0–0.1)
Basophils Relative: 0 %
Eosinophils Absolute: 0.1 10*3/uL (ref 0.0–0.5)
Eosinophils Relative: 1 %
HCT: 34.5 % — ABNORMAL LOW (ref 39.0–52.0)
Hemoglobin: 12 g/dL — ABNORMAL LOW (ref 13.0–17.0)
Immature Granulocytes: 0 %
Lymphocytes Relative: 24 %
Lymphs Abs: 1.8 10*3/uL (ref 0.7–4.0)
MCH: 29.9 pg (ref 26.0–34.0)
MCHC: 34.8 g/dL (ref 30.0–36.0)
MCV: 85.8 fL (ref 80.0–100.0)
Monocytes Absolute: 0.8 10*3/uL (ref 0.1–1.0)
Monocytes Relative: 10 %
Neutro Abs: 4.8 10*3/uL (ref 1.7–7.7)
Neutrophils Relative %: 65 %
Platelets: 369 10*3/uL (ref 150–400)
RBC: 4.02 MIL/uL — ABNORMAL LOW (ref 4.22–5.81)
RDW: 14.6 % (ref 11.5–15.5)
WBC: 7.5 10*3/uL (ref 4.0–10.5)
nRBC: 0 % (ref 0.0–0.2)

## 2022-07-11 LAB — COMPREHENSIVE METABOLIC PANEL
ALT: 26 U/L (ref 0–44)
AST: 22 U/L (ref 15–41)
Albumin: 4 g/dL (ref 3.5–5.0)
Alkaline Phosphatase: 69 U/L (ref 38–126)
Anion gap: 7 (ref 5–15)
BUN: 12 mg/dL (ref 8–23)
CO2: 31 mmol/L (ref 22–32)
Calcium: 9.7 mg/dL (ref 8.9–10.3)
Chloride: 99 mmol/L (ref 98–111)
Creatinine, Ser: 1.01 mg/dL (ref 0.61–1.24)
GFR, Estimated: >60 mL/min (ref >60)
Glucose, Bld: 113 mg/dL — ABNORMAL HIGH (ref 70–99)
Potassium: 3.4 mmol/L — ABNORMAL LOW (ref 3.5–5.1)
Sodium: 137 mmol/L (ref 135–145)
Total Bilirubin: 1.7 mg/dL — ABNORMAL HIGH (ref 0.3–1.2)
Total Protein: 7.7 g/dL (ref 6.5–8.1)

## 2022-07-11 MED ORDER — LIDOCAINE VISCOUS HCL 2 % MT SOLN
15.0000 mL | Freq: Once | OROMUCOSAL | Status: AC
  Administered 2022-07-11: 15 mL via ORAL
  Filled 2022-07-11: qty 15

## 2022-07-11 MED ORDER — IOHEXOL 300 MG/ML  SOLN
100.0000 mL | Freq: Once | INTRAMUSCULAR | Status: AC | PRN
  Administered 2022-07-11: 75 mL via INTRAVENOUS

## 2022-07-11 MED ORDER — AMOXICILLIN-POT CLAVULANATE 875-125 MG PO TABS
1.0000 | ORAL_TABLET | Freq: Two times a day (BID) | ORAL | 0 refills | Status: AC
  Filled 2022-07-11: qty 20, 10d supply, fill #0

## 2022-07-11 MED ORDER — ALUM & MAG HYDROXIDE-SIMETH 200-200-20 MG/5ML PO SUSP
30.0000 mL | Freq: Once | ORAL | Status: AC
  Administered 2022-07-11: 30 mL via ORAL
  Filled 2022-07-11: qty 30

## 2022-07-11 NOTE — ED Triage Notes (Addendum)
Pt arrives pov, slow gait with c/o LT sore throat and difficulty swallowing x 2 weeks. Pt endorses ability swallow secretions. Also reports LT side neck tenderness. Pt adds more increased shob recently

## 2022-07-11 NOTE — Discharge Instructions (Addendum)
You are provided with a prescription for antibiotics to help treat your infection, please take 1 tablet twice a day for the next 10 days.  Follow-up with your primary care physician if your symptoms have not improved.  Return to the emergency department if you experience any difficulty swallowing.

## 2022-07-11 NOTE — ED Provider Notes (Signed)
[facility] Provider Note   CSN: [id number] Arrival date & time: 07/11/22  1036     History  Chief Complaint  Patient presents with   Sore Throat    Nicholas Carroll is a 71 y.o. male.  72 year old male with no past medical history presents to the ED with a chief complaint of left sore throat which has been ongoing for the past 2 weeks.  Patient endorses pain with swallowing, is tolerating his secretions however there is severe tenderness to the left side of his throat.  He has tried warm salt gargles, some lozenges without any improvement in his symptoms.  He also reports a fever with a Tmax of 99. He denies any sick contacts. No nausea, no vomiting, or other complaints.   The history is provided by the patient.  Sore Throat This is a new problem. The current episode started more than 1 week ago. The problem occurs constantly. Pertinent negatives include no chest pain, no abdominal pain, no headaches and no shortness of breath.       Home Medications Prior to Admission medications   Medication Sig Start Date End Date Taking? Authorizing Provider  amoxicillin-clavulanate (AUGMENTIN) 875-125 MG tablet Take 1 tablet by mouth 2 (two) times daily for 10 days. 07/11/22 07/21/22 Yes Tannisha Kennington, PA-C  amLODipine (NORVASC) 5 MG tablet Take 1 tablet (5 mg total) by mouth daily. 07/04/18   Dixie Dials, MD  aspirin 81 MG tablet Take 81 mg by mouth daily.    [provider]  atorvastatin (LIPITOR) 80 MG tablet Take 1 tablet (80 mg total) by mouth daily. 05/23/22   Gerrit Heck, MD  clopidogrel (PLAVIX) 75 MG tablet Take 1 tablet (75 mg total) by mouth daily. 05/23/22   Gerrit Heck, MD  hydrochlorothiazide (HYDRODIURIL) 12.5 MG tablet Take 1 tablet by mouth daily.    [provider]  isosorbide mononitrate (IMDUR) 30 MG 24 hr tablet Take 1 tablet (30 mg total) by mouth daily. 01/06/21   Dixie Dials, MD  lisinopril  (PRINIVIL,ZESTRIL) 10 MG tablet Take 1 tablet (10 mg total) by mouth daily. 06/27/18   Drenda Freeze, MD  metoprolol tartrate (LOPRESSOR) 25 MG tablet Take 1/2 tablet (12.5 mg total) by mouth 2 (two) times daily. 05/22/22   Gerrit Heck, MD  nitroGLYCERIN (NITROSTAT) 0.4 MG SL tablet Place 1 tablet (0.4 mg total) under the tongue every 5 (five) minutes x 3 doses as needed for chest pain. 07/03/18   Dixie Dials, MD  polyethylene glycol powder (GLYCOLAX/MIRALAX) 17 GM/SCOOP powder Take 17 g by mouth daily. 05/22/22   Gerrit Heck, MD  tamsulosin (FLOMAX) 0.4 MG CAPS capsule Take 0.4 mg by mouth daily.    [provider]  torsemide (DEMADEX) 20 MG tablet Take 20 mg by mouth every Monday, Wednesday, and Friday. 06/04/22   [provider]      Allergies    Patient has no known allergies.    Review of Systems   Review of Systems  Constitutional:  Negative for chills and fever.  HENT:  Positive for sore throat.   Respiratory:  Negative for shortness of breath.   Cardiovascular:  Negative for chest pain.  Gastrointestinal:  Negative for abdominal pain.  Genitourinary:  Negative for flank pain.  Neurological:  Negative for light-headedness and headaches.  All other systems reviewed and are negative.   Physical Exam Updated Vital Signs BP 128/70 (BP Location: Right Arm)   Pulse 66  Temp 99.7 F (37.6 C) (Oral)   Resp 16   Wt 113.4 kg   SpO2 97%   BMI 31.25 kg/m  Physical Exam Vitals and nursing note reviewed.  Constitutional:      Appearance: He is well-developed.  HENT:     Mouth/Throat:     Tonsils: No tonsillar exudate or tonsillar abscesses.  Lymphadenopathy:     Cervical: Cervical adenopathy present.     Left cervical: Superficial cervical adenopathy present.  Neurological:     Mental Status: He is alert.     ED Results / Procedures / Treatments   Labs (all labs ordered are listed, but only abnormal results are displayed) Labs Reviewed  CBC  WITH DIFFERENTIAL/PLATELET - Abnormal; Notable for the following components:      Result Value   RBC 4.02 (*)    Hemoglobin 12.0 (*)    HCT 34.5 (*)    All other components within normal limits  COMPREHENSIVE METABOLIC PANEL - Abnormal; Notable for the following components:   Potassium 3.4 (*)    Glucose, Bld 113 (*)    Total Bilirubin 1.7 (*)    All other components within normal limits    EKG EKG Interpretation  Date/Time:  Thursday July 11 2022 10:57:54 EDT Ventricular Rate:  55 PR Interval:  159 QRS Duration: 89 QT Interval:  414 QTC Calculation: 396 R Axis:   58 Text Interpretation: Sinus rhythm Multiform ventricular premature complexes Abnormal R-wave progression, early transition Baseline wander in lead(s) V3 No significant change since last tracing Confirmed by Deno Etienne [phone]) on 07/11/2022 11:30:04 AM  Radiology CT Soft Tissue Neck W Contrast  Result Date: 07/11/2022 CLINICAL DATA:  Sore throat and difficulty swallowing for 2 weeks. EXAM: CT NECK WITH CONTRAST TECHNIQUE: Multidetector CT imaging of the neck was performed using the standard protocol following the bolus administration of intravenous contrast. RADIATION DOSE REDUCTION: This exam was performed according to the departmental dose-optimization program which includes automated exposure control, adjustment of the mA and/or kV according to patient size and/or use of iterative reconstruction technique. CONTRAST:  56mL OMNIPAQUE IOHEXOL 300 MG/ML  SOLN COMPARISON:  Cervical spine CT 08/23/2012 FINDINGS: Pharynx and larynx: The nasal cavity and nasopharynx are unremarkable. There is low-density thickening and swelling of the left oropharyngeal mucosa with stranding in the parapharyngeal space extending to the vallecula with asymmetric effacement. There is a 0.9 cm focus of hypodensity in the left palatine tonsil. The epiglottis is mildly thickened and edematous on the left (5-75). There is no retropharyngeal fluid  collection. The airway remains patent but with partial effacement of the oropharyngeal airway due to the swelling. There is extensive dental disease. Salivary glands: The parotid and submandibular glands are unremarkable. Thyroid: Unremarkable. Lymph nodes: There is a 0.9 cm left level IIA lymph node which maintains normal reniform morphology and fatty hilum. There are scattered additional subcentimeter bilateral cervical chain lymph nodes, favored reactive. There is no cystic/necrotic lymphadenopathy. Vascular: There is calcified plaque of the carotid bifurcations bilaterally. The major vasculature of the neck is otherwise unremarkable. Limited intracranial: The imaged intracranial compartment is unremarkable. Visualized orbits: The imaged globes and orbits are unremarkable. Mastoids and visualized paranasal sinuses: There is mild mucosal thickening in the left maxillary sinus. The mastoid air cells and middle ear cavities are clear. Skeleton: There is no acute osseous abnormality or suspicious osseous lesion. There is multilevel degenerative change of the cervical spine with disc space narrowing most advanced at C5-C6 and C6-C7. Upper chest: There is  a small calcified granuloma in the left apex and calcified bilateral hilar lymphadenopathy suggestive of prior granulomatous disease. The imaged lung apices are otherwise clear. Other: There is mild inflammatory stranding in the fat in the left sublingual region with slight asymmetric thickening of the left platysma muscle. IMPRESSION: 1. Low-density thickening of the left oropharyngeal mucosa extending to the vallecula most in keeping with tonsillitis/pharyngitis with suspected 9 mm abscess in the left palatine tonsil. Recommend correlation with symptoms and direct visualization and follow up to resolution. 2. Inflammatory fat stranding in the left sublingual region. 3. Prominent cervical chain lymph nodes are likely reactive. 4. Extensive dental disease.  Electronically Signed   By: Valetta Mole M.D.   On: 07/11/2022 13:12    Procedures Procedures    Medications Ordered in ED Medications  alum & mag hydroxide-simeth (MAALOX/MYLANTA) 200-200-20 MG/5ML suspension 30 mL (30 mLs Oral Given 07/11/22 1149)    And  lidocaine (XYLOCAINE) 2 % viscous mouth solution 15 mL (15 mLs Oral Given 07/11/22 1149)  iohexol (OMNIPAQUE) 300 MG/ML solution 100 mL (75 mLs Intravenous Contrast Given 07/11/22 1238)    ED Course/ Medical Decision Making/ A&P                             Medical Decision Making Amount and/or Complexity of Data Reviewed Labs: ordered. Radiology: ordered.  Risk OTC drugs. Prescription drug management.   This patient presents to the ED for concern of sore throat, this involves a number of treatment options, and is a complaint that carries with it a high risk of complications and morbidity.  The differential diagnosis includes strep pharyngitis, tonsillitis versus PTA.   Co morbidities: Discussed in HPI   Brief History:  Here with sore throat for approximately 1 week, exacerbated with any swallowing.  Endorsing some chills, along with low-grade temp at home.  No improvement despite over-the-counter medications.  EMR reviewed including pt PMHx, past surgical history and past visits to ER.   See HPI for more details   Lab Tests:  I ordered and independently interpreted labs.  The pertinent results include:    I personally reviewed all laboratory work and imaging. Metabolic panel without any acute abnormality specifically kidney function within normal limits and no significant electrolyte abnormalities. CBC without leukocytosis or significant anemia.   Imaging Studies:  CT Soft tissue: 1. Low-density thickening of the left oropharyngeal mucosa extending  to the vallecula most in keeping with tonsillitis/pharyngitis with  suspected 9 mm abscess in the left palatine tonsil. Recommend  correlation with symptoms and  direct visualization and follow up to  resolution.  2. Inflammatory fat stranding in the left sublingual region.  3. Prominent cervical chain lymph nodes are likely reactive.  4. Extensive dental disease.      Electronically Signed   Medicines ordered:  I ordered medication including viscous lidocaine  for pain control Reevaluation of the patient after these medicines showed that the patient stayed the same I have reviewed the patients home medicines and have made adjustments as needed  Consults:  I requested consultation with Dr. Marcelline Deist,  and discussed lab and imaging findings as well as pertinent plan - they recommend: Treatment for tonsillitis along with follow-up primary care physician.  Reevaluation:  After the interventions noted above I re-evaluated patient and found that they have :stayed the same   Social Determinants of Health:  The patient's social determinants of health were a factor in the care  of this patient   Problem List / ED Course:  Patient here with sore throat for approximately 1 week, reports exacerbated with any drinking or eating.  Has tried some salt gargle at home, over-the-counter medication without any improvement in symptoms.  Here he does have a low-grade temp of 99.7, no leukocytosis, blood work is overall unremarkable.  On evaluation there is no visualization of a exudate, there is significant tenderness along the cervical adenopathy on the left side.  He is overall nontoxic-appearing.  Given some viscous lidocaine to help with the pain.  CT did show a 9 mm palatine abscess present, discussed this case with Dr. Marcelline Deist of ENT who recommends that this is not a PTA and will need to treat like tonsillitis.  Patient will go home and a short course of Augmentin for 10 days.  We discussed appropriate follow-up with primary care physician.  Patient is hemodynamically stable for discharge.  Dispostion:  After consideration of the diagnostic results and  the patients response to treatment, I feel that the patent would benefit from treatment of tonsillitis with PCP after completion of antibiotics.    Portions of this note were generated with [employer]. Dictation errors may occur despite best attempts at proofreading.   Final Clinical Impression(s) / ED Diagnoses Final diagnoses:  Tonsillitis    Rx / DC Orders ED Discharge Orders          Ordered    amoxicillin-clavulanate (AUGMENTIN) 875-125 MG tablet  2 times daily        07/11/22 1332              Janeece Fitting, PA-C 07/11/22 [place], DO 07/11/22 1357

## 2022-07-11 NOTE — ED Notes (Signed)
Dc instructions reviewed with patient. Patient voiced understanding. Dc with belongings.  °

## 2022-07-20 ENCOUNTER — Other Ambulatory Visit: Payer: TRICARE For Life (TFL)

## 2022-08-10 ENCOUNTER — Ambulatory Visit
Admission: RE | Admit: 2022-08-10 | Discharge: 2022-08-10 | Disposition: A | Discharge status: Home / Self Care | Payer: TRICARE For Life (TFL) | Source: Ambulatory Visit | Attending: Urology

## 2022-08-10 DIAGNOSIS — C61 Malignant neoplasm of prostate: Secondary | ICD-10-CM

## 2022-08-10 MED ORDER — GADOPICLENOL 0.5 MMOL/ML IV SOLN
10.0000 mL | Freq: Once | INTRAVENOUS | Status: AC | PRN
  Administered 2022-08-10: 10 mL via INTRAVENOUS

## 2022-09-05 ENCOUNTER — Ambulatory Visit: Payer: Self-pay | Admitting: Internal Medicine

## 2022-09-05 NOTE — Progress Notes (Signed)
No show

## 2022-09-25 ENCOUNTER — Ambulatory Visit: Payer: TRICARE For Life (TFL) | Admitting: Internal Medicine

## 2022-12-04 ENCOUNTER — Encounter: Payer: Self-pay | Admitting: Cardiology

## 2022-12-04 ENCOUNTER — Ambulatory Visit: Payer: TRICARE For Life (TFL) | Admitting: Cardiology

## 2022-12-04 VITALS — BP 155/79 | HR 60 | Resp 16 | Ht 75.0 in | Wt 260.6 lb

## 2022-12-04 DIAGNOSIS — I2510 Atherosclerotic heart disease of native coronary artery without angina pectoris: Secondary | ICD-10-CM

## 2022-12-04 DIAGNOSIS — I10 Essential (primary) hypertension: Secondary | ICD-10-CM

## 2022-12-04 MED ORDER — METOPROLOL SUCCINATE ER 50 MG PO TB24: 50.0000 mg | ORAL_TABLET | Freq: Every day | ORAL | 3 refills | Status: DC

## 2022-12-04 MED ORDER — HYDROCHLOROTHIAZIDE 25 MG PO TABS: 25.0000 mg | ORAL_TABLET | Freq: Every day | ORAL | 3 refills | Status: DC

## 2022-12-04 NOTE — Progress Notes (Signed)
Follow up visit  Subjective:   Nicholas Carroll, male    DOB: 08-Sep-1950, 72 y.o.   MRN: [id number]     HPI  Chief Complaint  Patient presents with   Chest Pain   Hospitalization Follow-up    72 y.o. African-American male with hypertension, hyperlipidemia, CAD  Patient was previously seen by Dr. Melton Alar, I performed coronary angiogram in 07/2022.  At that time, he was not quite compliant with her his medical therapy.  This had a lot to do with his life stressors at that time.  He has been more compliant with medical therapy now.  He denies any chest pain, shortness of breath symptoms.  Blood pressure is elevated, as it is at home.  He does have mild leg swelling.  He denies any exertional dyspnea or orthopnea, PND.      Current Outpatient Medications:    amLODipine (NORVASC) 5 MG tablet, Take 1 tablet (5 mg total) by mouth daily., Disp: 30 tablet, Rfl: 3   aspirin 81 MG tablet, Take 81 mg by mouth daily., Disp: , Rfl:    atorvastatin (LIPITOR) 80 MG tablet, Take 1 tablet (80 mg total) by mouth daily., Disp: 30 tablet, Rfl: 0   clopidogrel (PLAVIX) 75 MG tablet, Take 1 tablet (75 mg total) by mouth daily., Disp: 30 tablet, Rfl: 0   diclofenac Sodium (VOLTAREN) 1 % GEL, Apply 2 g topically 4 (four) times daily., Disp: , Rfl:    hydrochlorothiazide (HYDRODIURIL) 12.5 MG tablet, Take 1 tablet by mouth daily., Disp: , Rfl:    isosorbide mononitrate (IMDUR) 30 MG 24 hr tablet, Take 1 tablet (30 mg total) by mouth daily., Disp: , Rfl:    lisinopril (PRINIVIL,ZESTRIL) 10 MG tablet, Take 1 tablet (10 mg total) by mouth daily., Disp: 30 tablet, Rfl: 0   metoprolol tartrate (LOPRESSOR) 25 MG tablet, Take 1/2 tablet (12.5 mg total) by mouth 2 (two) times daily., Disp: 60 tablet, Rfl: 0   nitroGLYCERIN (NITROSTAT) 0.4 MG SL tablet, Place 1 tablet (0.4 mg total) under the tongue every 5 (five) minutes x 3 doses as needed for chest pain., Disp: 25 tablet, Rfl: 1   polyethylene glycol powder  (GLYCOLAX/MIRALAX) 17 GM/SCOOP powder, Take 17 g by mouth daily., Disp: 500 g, Rfl: 0   tamsulosin (FLOMAX) 0.4 MG CAPS capsule, Take 0.4 mg by mouth daily., Disp: , Rfl:    torsemide (DEMADEX) 20 MG tablet, Take 20 mg by mouth every Monday, Wednesday, and Friday., Disp: , Rfl:    Cardiovascular & other pertient studies:  Reviewed external labs and tests, independently interpreted  EKG 07/15/2022: Sinus rhythm Multiform ventricular premature complexes Abnormal R-wave progression, early transition  Coronary angiography 05/21/2022: LM: Mid eccentric 40-50% ISR (Unchanged since 12/2020) LAD: Prox 20%, mid 50%, ostial diag 30% disease (Unchanged since 12/2020) Lcx: Small caliber, diffuse 40% disease (Unchanged since 12/2020) Ramus: Large vessel. Mid focal 70% stenosis (Slightly progressed since 12/2020) RCA: Mid diffuse 40%, distal 20-30% disease (Unchanged since 12/2020)   LVEDP 10 mmHg LVEF 55-60%   Minimally elevated and flat troponin similar to 12/2020. He does have exertional angina symptoms. However, he is not compliant with medical therapy, including Aspirin. If symptoms persist in spite of aggressive medical therapy, we could consider revascularization. Given the left main stenosis is upper end of moderate, but stable, we could consider PCI to ramus alone. If LM disease progresses, could consider heart team discussion for CABG vs PCI.   Echocardiogram 01/04/2022: 1. Left ventricular ejection  fraction, by estimation, is 60 to 65%. The  left ventricle has normal function. The left ventricle has no regional  wall motion abnormalities. Left ventricular diastolic parameters are  consistent with Grade I diastolic  dysfunction (impaired relaxation).   2. Right ventricular systolic function is normal. The right ventricular  size is normal. There is normal pulmonary artery systolic pressure.   3. Left atrial size was mildly dilated.   4. Right atrial size was mildly dilated.   5. The mitral  valve is degenerative. Mild mitral valve regurgitation.   6. The aortic valve is tricuspid. There is mild calcification of the  aortic valve. There is mild thickening of the aortic valve. Aortic valve  regurgitation is mild. Mild aortic valve sclerosis is present, with no  evidence of aortic valve stenosis.   7. The inferior vena cava is normal in size with greater than 50%  respiratory variability, suggesting right atrial pressure of 3 mmHg.    Recent labs: 07/11/2022: Glucose 113, BUN/Cr 12/1.01. EGFR >60. Na/K 137/3.4. T.bili 1.7. Rest of the CMP normal H/H 12/34. MCV 85. Platelets 369 Lipoprotein (a) 86  01/05/2021: HbA1C 6.2% Chol 114, TG 76, HDL 37, LDL 62   Review of Systems  Cardiovascular:  Negative for chest pain, dyspnea on exertion, leg swelling, palpitations and syncope.         Vitals:   12/04/22 1118  BP: (!) 155/79  Pulse: 60  Resp: 16  SpO2: 98%    Body mass index is 32.57 kg/m. Filed Weights   12/04/22 1118  Weight: 260 lb 9.6 oz (118.2 kg)     Objective:   Physical Exam Vitals and nursing note reviewed.  Constitutional:      General: He is not in acute distress. Neck:     Vascular: No JVD.  Cardiovascular:     Rate and Rhythm: Normal rate and regular rhythm.     Heart sounds: Normal heart sounds. No murmur heard. Pulmonary:     Effort: Pulmonary effort is normal.     Breath sounds: Normal breath sounds. No wheezing or rales.  Musculoskeletal:     Right lower leg: Edema (1+) present.     Left lower leg: Edema (1+) present.             Visit diagnoses:   ICD-10-CM   1. Essential hypertension  I10 Comprehensive Metabolic Panel (CMET)    Pro b natriuretic peptide (BNP)[facility])    Lipid panel    2. Coronary artery disease involving native coronary artery of native heart without angina pectoris  I25.10 Comprehensive Metabolic Panel (CMET)    Pro b natriuretic peptide (BNP)[facility])     Lipid panel       Orders Placed This Encounter  Procedures   Comprehensive Metabolic Panel (CMET)   Pro b natriuretic peptide (BNP)[facility])   Lipid panel   Meds ordered this encounter  Medications   hydrochlorothiazide (HYDRODIURIL) 25 MG tablet    Sig: Take 1 tablet (25 mg total) by mouth daily.    Dispense:  90 tablet    Refill:  3   metoprolol succinate (TOPROL-XL) 50 MG 24 hr tablet    Sig: Take 1 tablet (50 mg total) by mouth daily. Take with or immediately following a meal.    Dispense:  90 tablet    Refill:  3     Assessment & Recommendations:   72 y.o. African-American male with hypertension, hyperlipidemia, CAD  CAD:  Multiple prior PCI with mild restenosis as well as moderate native artery disease on cath in 07/2022. In absence of anginal symptoms, continue medical management. He does not need dual antiplatelet therapy at this time, continue aspirin 81 mg daily. Continue aspirin, statin, aggressive blood pressure and lipid control, as below.  Hypertension: Uncontrolled. Increase hydrochlorothiazide to 25 mg daily. Given his leg edema, also increase torsemide to 20 mg daily.  There is possibility that some of his leg edema is due to amlodipine or possibly venous insufficiency. I will check BMP, proBNP, along with lipid panel.  Mixed hyperlipidemia: Lipids well-controlled in 2022.  Continue statin.  Will repeat lipid panel now.  F/u in 3 months    Elder Negus, MD Pager: [phone] Office: [phone]

## 2023-01-27 IMAGING — CR DG KNEE COMPLETE 4+V*L*
4 series · 4 of 4 positions shown · non-contrast
Comparison: None.

CLINICAL DATA: Left lateral knee pain.  Fall yesterday.

EXAM:
LEFT KNEE - COMPLETE 4+ VIEW

[t knee ap left]
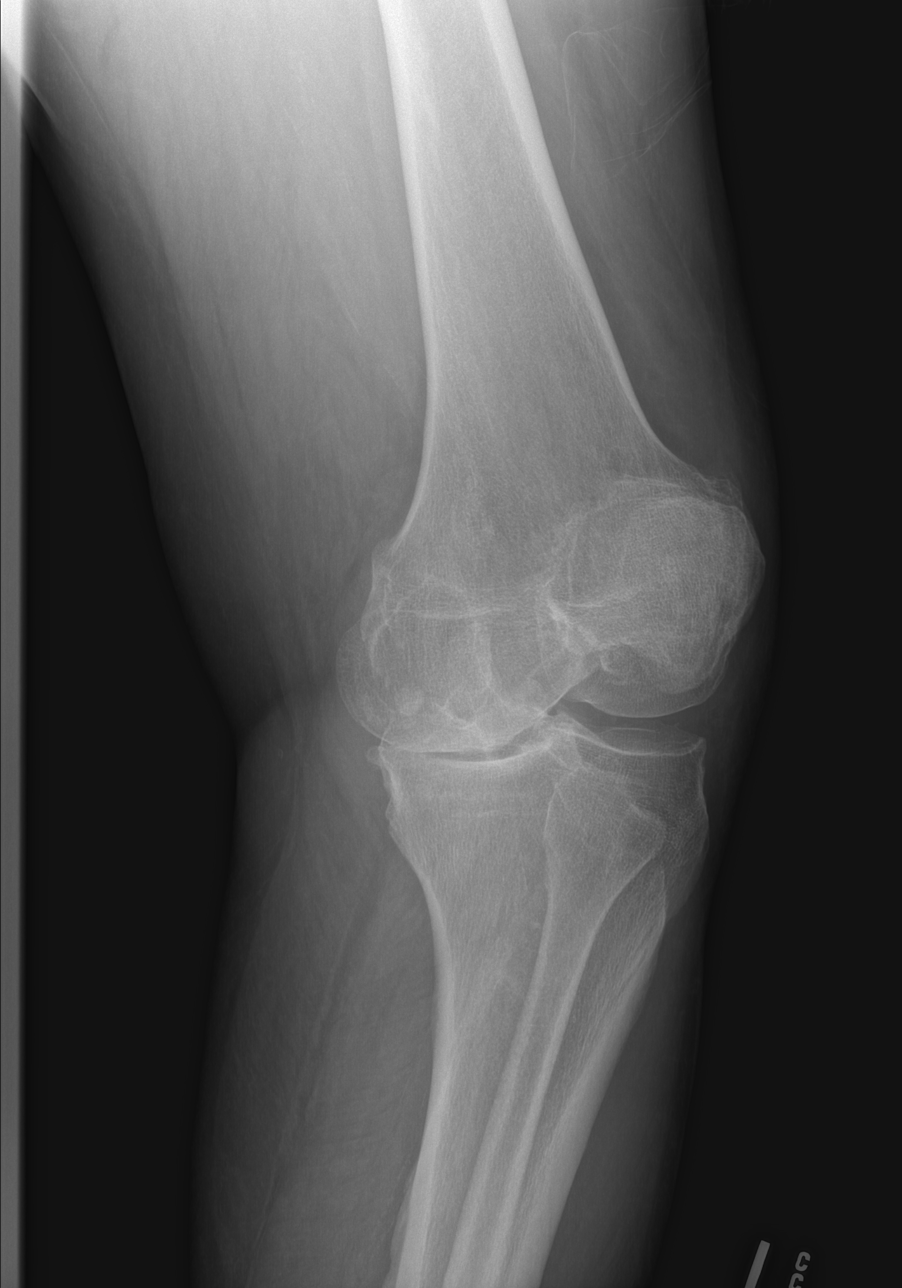

[t knee obl left (1 of 2)]
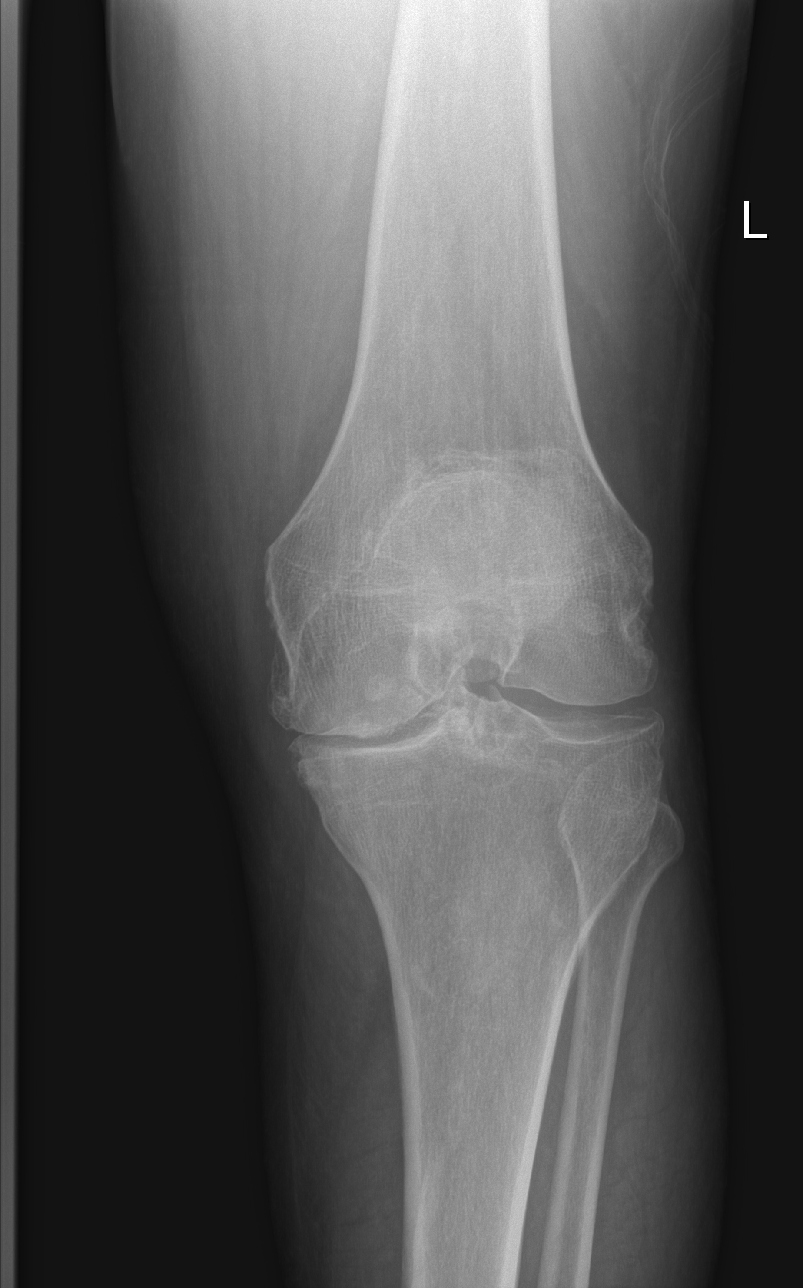

[t knee obl left (2 of 2)]
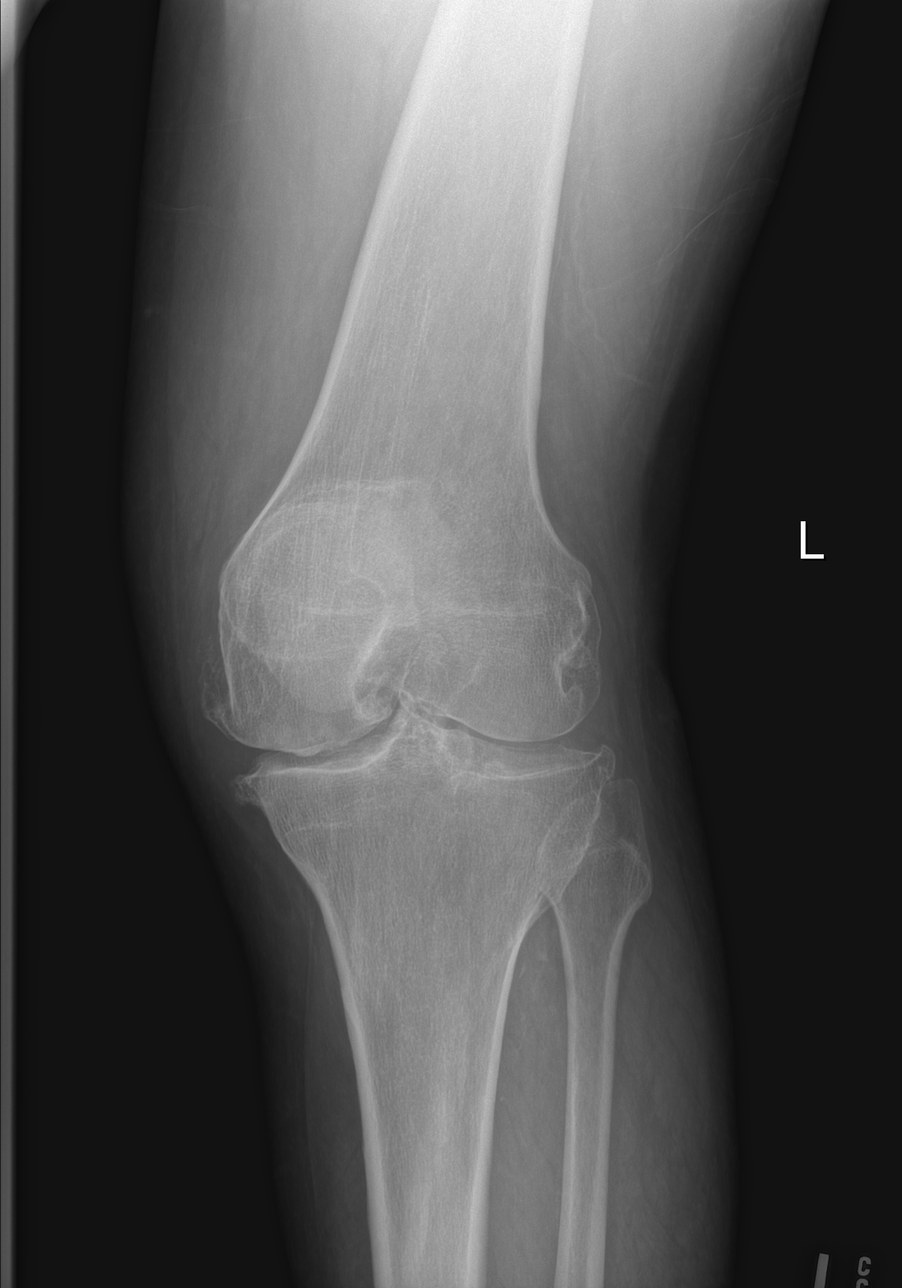

[t knee lat left]
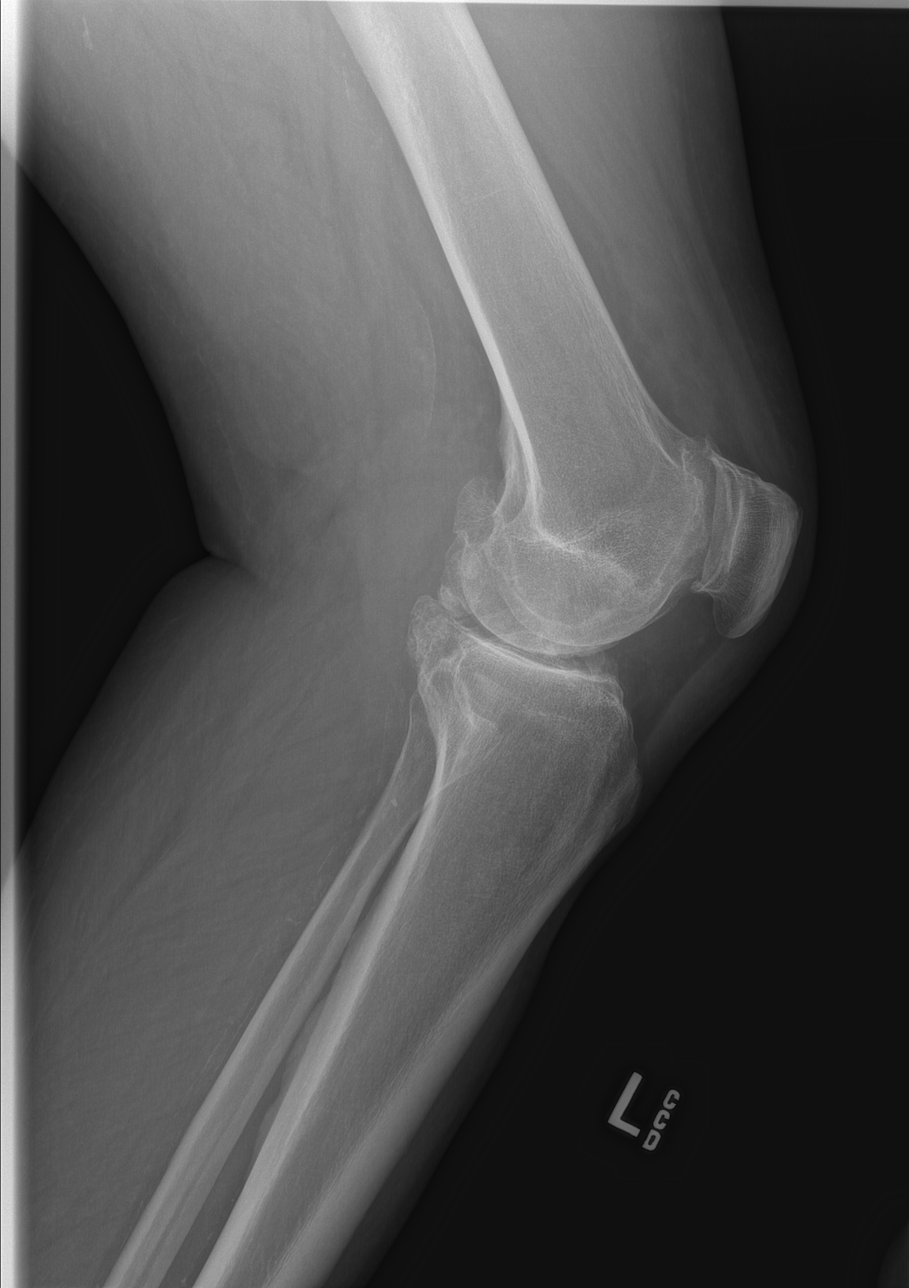

[4 of 4 positions shown; findings below may reference images not displayed]

FINDINGS: No acute fracture, dislocation, or knee joint effusion is
identified. There is moderate to severe medial compartment joint
space narrowing, and there is prominent tricompartmental marginal
osteophytosis. The soft tissues are unremarkable.
IMPRESSION: 1. No acute osseous abnormality identified.
2. Tricompartmental osteoarthrosis.

## 2023-03-03 ENCOUNTER — Ambulatory Visit: Payer: Medicare PPO | Attending: Cardiology | Admitting: Cardiology

## 2023-03-04 ENCOUNTER — Encounter: Payer: Self-pay | Admitting: Cardiology

## 2023-10-02 ENCOUNTER — Other Ambulatory Visit: Payer: Self-pay | Admitting: Cardiology

## 2023-10-22 ENCOUNTER — Other Ambulatory Visit: Payer: Self-pay | Admitting: Cardiology

## 2023-12-31 ENCOUNTER — Encounter: Payer: Self-pay | Admitting: Adult Health

## 2023-12-31 ENCOUNTER — Other Ambulatory Visit: Payer: Self-pay | Admitting: Adult Health

## 2023-12-31 DIAGNOSIS — R972 Elevated prostate specific antigen [PSA]: Secondary | ICD-10-CM

## 2024-01-06 ENCOUNTER — Other Ambulatory Visit: Payer: Self-pay | Admitting: Cardiology

## 2024-01-19 ENCOUNTER — Other Ambulatory Visit: Payer: Self-pay | Admitting: Cardiology

## 2024-02-28 ENCOUNTER — Inpatient Hospital Stay: Admission: RE | Admit: 2024-02-28
